# Patient Record
Sex: Male | Born: 1975 | Race: Black or African American | Hispanic: No | Marital: Married | Smoking: Never smoker
Health system: Southern US, Community
[De-identification: ages and names within clinical notes are randomized; demographics above are authoritative.]

## PROBLEM LIST (undated history)

## (undated) DIAGNOSIS — F419 Anxiety disorder, unspecified: Secondary | ICD-10-CM

## (undated) DIAGNOSIS — F319 Bipolar disorder, unspecified: Secondary | ICD-10-CM

## (undated) DIAGNOSIS — F32A Depression, unspecified: Secondary | ICD-10-CM

## (undated) HISTORY — PX: COLONOSCOPY, DIAGNOSTIC (SCREENING): SHX174

## (undated) HISTORY — PX: OTHER SURGICAL HISTORY: SHX169

## (undated) HISTORY — DX: Bipolar disorder, unspecified: F31.9

---

## 2011-05-29 DIAGNOSIS — F331 Major depressive disorder, recurrent, moderate: Secondary | ICD-10-CM | POA: Insufficient documentation

## 2017-12-07 HISTORY — PX: WISDOM TOOTH EXTRACTION: SHX21

## 2019-06-19 ENCOUNTER — Ambulatory Visit (FREE_STANDING_LABORATORY_FACILITY): Payer: No Typology Code available for payment source

## 2019-06-19 ENCOUNTER — Ambulatory Visit
Admission: RE | Admit: 2019-06-19 | Discharge: 2019-06-19 | Disposition: A | Discharge status: Home / Self Care | Payer: No Typology Code available for payment source | Source: Ambulatory Visit | Attending: Internal Medicine | Admitting: Internal Medicine

## 2019-06-19 ENCOUNTER — Encounter (INDEPENDENT_AMBULATORY_CARE_PROVIDER_SITE_OTHER): Payer: Self-pay | Admitting: Internal Medicine

## 2019-06-19 ENCOUNTER — Telehealth (INDEPENDENT_AMBULATORY_CARE_PROVIDER_SITE_OTHER): Payer: Self-pay | Admitting: Internal Medicine

## 2019-06-19 ENCOUNTER — Telehealth (INDEPENDENT_AMBULATORY_CARE_PROVIDER_SITE_OTHER): Payer: No Typology Code available for payment source | Admitting: Internal Medicine

## 2019-06-19 ENCOUNTER — Telehealth: Payer: Self-pay

## 2019-06-19 DIAGNOSIS — R059 Cough, unspecified: Secondary | ICD-10-CM

## 2019-06-19 DIAGNOSIS — R51 Headache: Secondary | ICD-10-CM

## 2019-06-19 DIAGNOSIS — R6889 Other general symptoms and signs: Secondary | ICD-10-CM

## 2019-06-19 DIAGNOSIS — R6883 Chills (without fever): Secondary | ICD-10-CM

## 2019-06-19 DIAGNOSIS — R05 Cough: Secondary | ICD-10-CM

## 2019-06-19 DIAGNOSIS — B349 Viral infection, unspecified: Secondary | ICD-10-CM

## 2019-06-19 DIAGNOSIS — R0602 Shortness of breath: Secondary | ICD-10-CM | POA: Insufficient documentation

## 2019-06-19 DIAGNOSIS — R197 Diarrhea, unspecified: Secondary | ICD-10-CM

## 2019-06-19 DIAGNOSIS — R519 Headache, unspecified: Secondary | ICD-10-CM

## 2019-06-19 DIAGNOSIS — Z03818 Encounter for observation for suspected exposure to other biological agents ruled out: Secondary | ICD-10-CM

## 2019-06-19 DIAGNOSIS — R110 Nausea: Secondary | ICD-10-CM

## 2019-06-19 DIAGNOSIS — R5383 Other fatigue: Secondary | ICD-10-CM | POA: Insufficient documentation

## 2019-06-19 DIAGNOSIS — Z20822 Contact with and (suspected) exposure to covid-19: Secondary | ICD-10-CM

## 2019-06-19 LAB — CBC AND DIFFERENTIAL
Absolute NRBC: 0 10*3/uL (ref 0.00–0.00)
Basophils Absolute Automated: 0.04 10*3/uL (ref 0.00–0.08)
Basophils Automated: 1 %
Eosinophils Absolute Automated: 0.09 10*3/uL (ref 0.00–0.44)
Eosinophils Automated: 2.2 %
Hematocrit: 46.5 % (ref 37.6–49.6)
Hgb: 15.5 g/dL (ref 12.5–17.1)
Immature Granulocytes Absolute: 0.02 10*3/uL (ref 0.00–0.07)
Immature Granulocytes: 0.5 %
Lymphocytes Absolute Automated: 1.37 10*3/uL (ref 0.42–3.22)
Lymphocytes Automated: 33.7 %
MCH: 29.2 pg (ref 25.1–33.5)
MCHC: 33.3 g/dL (ref 31.5–35.8)
MCV: 87.7 fL (ref 78.0–96.0)
MPV: 11.1 fL (ref 8.9–12.5)
Monocytes Absolute Automated: 0.35 10*3/uL (ref 0.21–0.85)
Monocytes: 8.6 %
Neutrophils Absolute: 2.2 10*3/uL (ref 1.10–6.33)
Neutrophils: 54 %
Nucleated RBC: 0 /100 WBC (ref 0.0–0.0)
Platelets: 195 10*3/uL (ref 142–346)
RBC: 5.3 10*6/uL (ref 4.20–5.90)
RDW: 12 % (ref 11–15)
WBC: 4.07 10*3/uL (ref 3.10–9.50)

## 2019-06-19 LAB — COMPREHENSIVE METABOLIC PANEL
ALT: 31 U/L (ref 0–55)
AST (SGOT): 18 U/L (ref 5–34)
Albumin/Globulin Ratio: 1.5 (ref 0.9–2.2)
Albumin: 4 g/dL (ref 3.5–5.0)
Alkaline Phosphatase: 77 U/L (ref 38–106)
Anion Gap: 9 (ref 5.0–15.0)
BUN: 18 mg/dL (ref 9.0–28.0)
Bilirubin, Total: 0.3 mg/dL (ref 0.2–1.2)
CO2: 28 mEq/L (ref 21–29)
Calcium: 9.5 mg/dL (ref 8.5–10.5)
Chloride: 104 mEq/L (ref 100–111)
Creatinine: 1.2 mg/dL (ref 0.5–1.5)
Globulin: 2.6 g/dL (ref 2.0–3.7)
Glucose: 100 mg/dL (ref 70–100)
Potassium: 4.2 mEq/L (ref 3.5–5.1)
Protein, Total: 6.6 g/dL (ref 6.0–8.3)
Sodium: 141 mEq/L (ref 136–145)

## 2019-06-19 LAB — HEMOLYSIS INDEX: Hemolysis Index: 8 (ref 0–18)

## 2019-06-19 LAB — GFR: EGFR: >60

## 2019-06-19 NOTE — Progress Notes (Signed)
Have you seen any specialists/other providers since your last visit with us?    No    Arm preference verified?   No    The patient is due for nothing at this time, HM is up-to-date.

## 2019-06-19 NOTE — Patient Instructions (Signed)
Please get your blood work, stool test, COVID test, and chest xray done.    Stay home and rest for 5 days.    Self quarantine until you get your negative COVID test.    If positive, you have to self quarantine for 14 days.    Follow up after test results become available.

## 2019-06-19 NOTE — Telephone Encounter (Signed)
Pt Rodney Ingram called [facility]  for advice related to their current health.     Pt last evaluated by office on:  NEW PT      COVID-19 Questionnaire:  Last updated: Apr 14, 2019    Fever: FEELS FEVERISH  Cough: YES  Dyspnea: WITH EXERTION  Fatigue: YES  Muscle or body aches: YES ARMS AND LEGS  Headache: YES  New loss of taste or smell: NO  Nausea or vomiting: NAUSEA  Diarrhea:  YES  Close contact with a COVID-19 person?: NO  Recently been tested for COVID-19?: NO  Ever been diagnosed with COVID-19?: NO  Live in a group living residence (such as an assisted-living facility, nursing home, shelter, or dormitory)?: NO  Work in a healthcare facility?: NO       Pt has been sick for  days.    Pt complaints in addition to those documented above:  PT STATES WENT TO [place] July 4 AND SSX WITH SSX 5 DAYS PT WAS AROUND CROWDS    Pt currently taking these medications:   No current outpatient medications on file.     No current facility-administered medications for this visit.        Assessment/Plan:    VIDEO APPT MADE. VERIFIED INSURANCE, EMAIL AND CONTACT NUMBER. ROUTED NOTE TO PROVIDER AND OFFICE STAFF.

## 2019-06-19 NOTE — Telephone Encounter (Signed)
Error

## 2019-06-19 NOTE — Progress Notes (Signed)
Subjective:     Verbal consent has been obtained from the patient to conduct a video and telephone visit to minimize exposure to COVID-19: yes    Date: 06/19/2019 10:16 AM   Patient ID: Rodney Ingram is a 43 y.o. male.    Chief Complaint:  Chief Complaint   Patient presents with    Covid-19 Screening     pt is c/o nausea, headaches, chills, sob, fatigue for the about 5 days and still progressing. went to [facility] for 06/10/2019       HPI    The purpose of today's visit is to address conditions listed below.    Patient Active Problem List   Diagnosis    Nausea    Headache    Chills    SOB (shortness of breath)    Fatigue       Pt reports:    C/o worsening headache, chills, nausea, SOB, fatigue, muscle aches, runny nose, diarrhea x 5 days, went to [place] on 06/10/19, was around crowds, worried about possible COVID infection, wore a mask    Headaches (left temple, sharp, lasting for 3-4 hrs) started 5 days ago, followed by nausea (no vomiting), fatigue, body aches started 3 days go, started having SOB yesterday afternoon, loose stool started 3 days ago, chills started 3 days ago, runny nose 2 days ago.      Pt denies F/C/N/V/D/CP/SOB/abd pain/urinary sx's/focal weakness, numbness or any other sx's.    Pt's recent medical records prior to today's visit were reviewed.    Last office visit here: N/A    Last specialty office visit:    No results found for: WBC, HGB, HCT, MCV, PLT    No results found for: CHOL, TRIG, HDL, LDL, CREAT, TSH, GLU, HGBA1C, VITD, 25HYDROXYDTO, INR, PT    No results found for: ALT, AST, ALKPHOS, BILITOTAL    Last immunizations:      There is no immunization history on file for this patient.     Health Maintenance:    There are no preventive care reminders to display for this patient.      Current Medications:    Current Outpatient Medications on File Prior to Visit   Medication Sig Dispense Refill    Multiple Vitamins-Minerals (MULTIVITAMIN ADULT PO) Take by mouth       No current facility-administered  medications on file prior to visit.        Allergies   Allergen Reactions    Penicillins Facial Swelling       History reviewed. No pertinent past medical history.    Past Surgical History:   Procedure Laterality Date    COLONOSCOPY      WISDOM TOOTH EXTRACTION  2019       History reviewed. No pertinent family history.    Social History     Tobacco Use    Smoking status: Never Smoker    Smokeless tobacco: Never Used   Substance Use Topics    Alcohol use: Yes    Drug use: Never       The following sections were reviewed this encounter by the provider:   Tobacco   Allergies   Meds   Problems   Med Hx   Surg Hx   Fam Hx            Review of Systems    All other systems were reviewed and are negative except as noted in HPI.         Objective:  Due to the current COVID-19 pandemic, this encounter was conducted as video and telephone visit to assure safety of the patient.    General: awake, alert, oriented x 3; no acute distress.    Assessment/Plan:       1. Nausea    2. Nonintractable headache, unspecified chronicity pattern, unspecified headache type    3. Chills    4. SOB (shortness of breath)  - X-ray chest PA and lateral; Future    5. Fatigue, unspecified type    6. Viral syndrome  - COVID-19 (SARS-CoV-2) [facility]; Future  - CBC without differential; Future  - Comprehensive metabolic panel; Future  - X-ray chest PA and lateral; Future    7. Diarrhea, unspecified type  - CBC without differential; Future  - Comprehensive metabolic panel; Future  - Culture, Stool, SALM/SHIGELLA/CAMPY/EHEC; Future  - Ova and parasite examination; Future  - Stool Cryptosporidium / Giardia Antigen; Future    Concerning for possible COVID-19.  Bld work, stool study, COVID test, CXR.  Stay home to rest for 5 days, letter sent to Pt.  Follow up after test results become available.      Following instructions were given to Pt:    "Please get your blood work, stool test, COVID test, and chest xray done.    Stay home and rest  for 5 days.    Self quarantine until you get your negative COVID test.    If positive, you have to self quarantine for 14 days.    Follow up after test results become available."    Follow up:     Return in about 1 week (around 06/26/2019) for review test results.    Gardiner Sleeper, MD    TOTAL TIME SPENT WITH PATIENT: 20 minutes    TIME SPENT COUNSELING/COORDINATING CARE: >50% of total time spent with patient was regarding counseling/coordinating of care - see Pt's instructions above.    Risk & Benefits of any previous or new medication(s) were explained to the patient who verbalized understanding & agreed to the treatment plan.  Pt was advise to call with updates/questions/concerns .

## 2019-06-20 ENCOUNTER — Encounter (INDEPENDENT_AMBULATORY_CARE_PROVIDER_SITE_OTHER): Payer: Self-pay

## 2019-06-20 ENCOUNTER — Ambulatory Visit
Admission: RE | Admit: 2019-06-20 | Discharge: 2019-06-20 | Disposition: A | Discharge status: Home / Self Care | Payer: No Typology Code available for payment source | Source: Ambulatory Visit | Attending: Internal Medicine | Admitting: Internal Medicine

## 2019-06-20 DIAGNOSIS — R197 Diarrhea, unspecified: Secondary | ICD-10-CM | POA: Insufficient documentation

## 2019-06-21 ENCOUNTER — Encounter (INDEPENDENT_AMBULATORY_CARE_PROVIDER_SITE_OTHER): Payer: Self-pay

## 2019-06-21 LAB — COVID-19 (SARS-COV-2): SARS CoV 2 Overall Result: NOT DETECTED

## 2019-06-28 LAB — OVA AND PARASITE EXAMINATION

## 2019-06-29 ENCOUNTER — Encounter (INDEPENDENT_AMBULATORY_CARE_PROVIDER_SITE_OTHER): Payer: Self-pay

## 2019-09-20 ENCOUNTER — Telehealth (INDEPENDENT_AMBULATORY_CARE_PROVIDER_SITE_OTHER): Payer: Self-pay | Admitting: Internal Medicine

## 2019-09-20 NOTE — Telephone Encounter (Signed)
Byrd Hesselbach from [facility] department requested an updated Chest Xray order for 06-19-2019 with a Covid Diagnosis Fax to her Attn Fax# [phone], if any questions she can be reach at [phone]

## 2019-09-22 NOTE — Telephone Encounter (Signed)
Fwd

## 2019-09-22 NOTE — Telephone Encounter (Signed)
I can't retrodate the order.  COVID was negative, so what do they need from me?

## 2019-09-22 NOTE — Telephone Encounter (Signed)
Advised Rod Holler w/ [facility] billing

## 2022-05-20 ENCOUNTER — Encounter (HOSPITAL_COMMUNITY): Payer: Self-pay | Admitting: *Deleted

## 2022-05-20 ENCOUNTER — Ambulatory Visit (INDEPENDENT_AMBULATORY_CARE_PROVIDER_SITE_OTHER): Payer: 59

## 2022-05-20 ENCOUNTER — Other Ambulatory Visit: Payer: Self-pay

## 2022-05-20 ENCOUNTER — Emergency Department (HOSPITAL_COMMUNITY): Payer: 59

## 2022-05-20 ENCOUNTER — Ambulatory Visit
Admission: EM | Admit: 2022-05-20 | Discharge: 2022-05-20 | Disposition: A | Discharge status: Home / Self Care | Payer: 59

## 2022-05-20 ENCOUNTER — Emergency Department (HOSPITAL_COMMUNITY)
Admission: EM | Admit: 2022-05-20 | Discharge: 2022-05-20 | Disposition: A | Discharge status: Home / Self Care | Payer: 59 | Attending: Emergency Medicine | Admitting: Emergency Medicine

## 2022-05-20 DIAGNOSIS — M7989 Other specified soft tissue disorders: Secondary | ICD-10-CM | POA: Insufficient documentation

## 2022-05-20 DIAGNOSIS — S63257A Unspecified dislocation of left little finger, initial encounter: Secondary | ICD-10-CM | POA: Diagnosis not present

## 2022-05-20 DIAGNOSIS — S63259A Unspecified dislocation of unspecified finger, initial encounter: Secondary | ICD-10-CM

## 2022-05-20 DIAGNOSIS — W230XXA Caught, crushed, jammed, or pinched between moving objects, initial encounter: Secondary | ICD-10-CM | POA: Insufficient documentation

## 2022-05-20 DIAGNOSIS — Y9367 Activity, basketball: Secondary | ICD-10-CM | POA: Diagnosis not present

## 2022-05-20 DIAGNOSIS — S63287A Dislocation of proximal interphalangeal joint of left little finger, initial encounter: Secondary | ICD-10-CM | POA: Diagnosis not present

## 2022-05-20 DIAGNOSIS — S6992XA Unspecified injury of left wrist, hand and finger(s), initial encounter: Secondary | ICD-10-CM | POA: Diagnosis present

## 2022-05-20 MED ORDER — LIDOCAINE HCL (PF) 1 % IJ SOLN
5.0000 mL | Freq: Once | INTRAMUSCULAR | Status: AC
  Administered 2022-05-20: 5 mL
  Filled 2022-05-20: qty 5

## 2022-05-20 MED ORDER — IBUPROFEN 400 MG PO TABS
600.0000 mg | ORAL_TABLET | Freq: Once | ORAL | Status: AC
  Administered 2022-05-20: 600 mg via ORAL
  Filled 2022-05-20: qty 2

## 2022-05-20 NOTE — ED Notes (Signed)
Edp in room  

## 2022-05-20 NOTE — ED Triage Notes (Signed)
Pt reports pain and swelling in the left pinky finger, after he fell over when playing basketball around 15:00 today.

## 2022-05-20 NOTE — ED Provider Notes (Signed)
[facility]    CSN: [id number] Arrival date & time: 05/20/22  1656      History   Chief Complaint Chief Complaint  Patient presents with   Finger Injury    HPI Keith Gilbert is a 46 y.o. male.   Presenting today with about 3 hours of left little finger pain, swelling after falling directly onto the finger with his entire body weight.  He states he does feel some numbness to the area, no discoloration.  Does not have good range of motion in the finger.  So far not trying thing over-the-counter for symptoms, came straight in after it happened.    History reviewed. No pertinent past medical history.  There are no problems to display for this patient.   History reviewed. No pertinent surgical history.     Home Medications    Prior to Admission medications   Medication Sig Start Date End Date Taking? Authorizing Provider  Multiple Vitamin (MULTIVITAMIN) tablet Take 1 tablet by mouth daily.   Yes [provider]    Family History Family History  Problem Relation Age of Onset   Heart disease Mother    Heart disease Father     Social History Social History   Tobacco Use   Smoking status: Never   Smokeless tobacco: Never  Substance Use Topics   Alcohol use: Yes   Drug use: Yes    Frequency: 4.0 times per week    Types: Marijuana     Allergies   Morphine and Minocycline   Review of Systems Review of Systems Per HPI  Physical Exam Triage Vital Signs ED Triage Vitals  Enc Vitals Group     BP 05/20/22 1835 122/81     Pulse Rate 05/20/22 1835 96     Resp 05/20/22 1835 16     Temp 05/20/22 1835 98.1 F (36.7 C)     Temp Source 05/20/22 1835 Oral     SpO2 05/20/22 1835 95 %     Weight --      Height --      Head Circumference --      Peak Flow --      Pain Score 05/20/22 1833 3     Pain Loc --      Pain Edu? --      Excl. in [place]? --    No data found.  Updated Vital Signs BP 122/81 (BP Location: Right Arm)   Pulse 96    Temp 98.1 F (36.7 C) (Oral)   Resp 16   SpO2 95%   Visual Acuity Right Eye Distance:   Left Eye Distance:   Bilateral Distance:    Right Eye Near:   Left Eye Near:    Bilateral Near:     Physical Exam Vitals and nursing note reviewed.  Constitutional:      Appearance: Normal appearance.  HENT:     Head: Atraumatic.  Eyes:     Extraocular Movements: Extraocular movements intact.     Conjunctiva/sclera: Conjunctivae normal.  Cardiovascular:     Rate and Rhythm: Normal rate and regular rhythm.  Pulmonary:     Effort: Pulmonary effort is normal.     Breath sounds: Normal breath sounds.  Musculoskeletal:        General: Swelling, tenderness, deformity and signs of injury present.     Cervical back: Normal range of motion and neck supple.     Comments: Deformity at the PIP joint of the left little finger, tenderness to  palpation, diffuse edema to this area  Skin:    General: Skin is warm and dry.  Neurological:     General: No focal deficit present.     Mental Status: He is oriented to person, place, and time.     Comments: Left hand neurovascular intact  Psychiatric:        Mood and Affect: Mood normal.        Thought Content: Thought content normal.        Judgment: Judgment normal.    UC Treatments / Results  Labs (all labs ordered are listed, but only abnormal results are displayed) Labs Reviewed - No data to display  EKG   Radiology DG Finger Little Left  Result Date: 05/20/2022 CLINICAL DATA:  Hyperextended finger playing basketball EXAM: LEFT LITTLE FINGER 2+V COMPARISON:  None Available. FINDINGS: There is dorsal dislocation of the fifth digit at the proximal interphalangeal joint. The middle phalanx is displaced 1 bone width dorsally in relation to the proximal phalanx. No fracture. IMPRESSION: Dislocation of the fifth digit at the PIP. Electronically Signed   By: Suzy Bouchard M.D.   On: 05/20/2022 18:48    Procedures Procedures (including critical  care time)  Medications Ordered in UC Medications - No data to display  Initial Impression / Assessment and Plan / UC Course  I have reviewed the triage vital signs and the nursing notes.  Pertinent labs & imaging results that were available during my care of the patient were reviewed by me and considered in my medical decision making (see chart for details).     X-ray showing a dislocation of the left little finger at the PIP joint.  Recommended that patient go to the emergency department for pain control and intervention which she is readily agreeable to.  Discussed protection of the joint with a loose finger splint in the meantime.  Wishes to go via private vehicle and is hemodynamically stable to do so.  Final Clinical Impressions(s) / UC Diagnoses   Final diagnoses:  Dislocation of finger, initial encounter   Discharge Instructions   None    ED Prescriptions   None    PDMP not reviewed this encounter.   Volney American, [place] 05/20/22 1901

## 2022-05-20 NOTE — ED Provider Notes (Signed)
[facility] EMERGENCY DEPARTMENT Provider Note   CSN: [id number] Arrival date & time: 05/20/22  1923     History  Chief Complaint  Patient presents with   Finger Injury    Keith Gilbert is a 46 y.o. male.  HPI 46 year old male presents after a left pinky injury.  He was playing basketball and landed on the floor on his hand and thinks he jammed his finger.  No numbness but is having pain.  He went to urgent care where he was diagnosed with a dislocated PIP joint and referred here.  He is right-handed.  No significant past medical history.  Home Medications Prior to Admission medications   Medication Sig Start Date End Date Taking? Authorizing Provider  aspirin 81 MG chewable tablet Chew 81 mg by mouth daily as needed for moderate pain.   Yes [provider]  Coenzyme Q10 300 MG CAPS Take by mouth. 05/31/21  Yes [provider]  Multiple Vitamin (MULTIVITAMIN) tablet Take 1 tablet by mouth daily.   Yes [provider]      Allergies    Morphine and Minocycline    Review of Systems   Review of Systems  Musculoskeletal:  Positive for arthralgias and joint swelling.  Neurological:  Negative for numbness.    Physical Exam Updated Vital Signs BP 124/75   Pulse 85   Temp 97.9 F (36.6 C) (Oral)   Resp 16   Ht 6\' 1"  (1.854 m)   Wt 99.8 kg   SpO2 98%   BMI 29.03 kg/m  Physical Exam Vitals and nursing note reviewed.  Constitutional:      Appearance: He is well-developed.  HENT:     Head: Normocephalic and atraumatic.  Cardiovascular:     Rate and Rhythm: Normal rate.     Pulses:          Radial pulses are 2+ on the left side.  Pulmonary:     Effort: Pulmonary effort is normal.  Abdominal:     Tenderness: There is no abdominal tenderness.  Musculoskeletal:     Comments: Left pinky finger with proximal swelling and tenderness.  No lacerations.  Skin:    General: Skin is warm and dry.  Neurological:     Mental Status: He is alert.      ED Results / Procedures / Treatments   Labs (all labs ordered are listed, but only abnormal results are displayed) Labs Reviewed - No data to display  EKG None  Radiology DG Finger Little Left  Result Date: 05/20/2022 CLINICAL DATA:  Post reduction. EXAM: LEFT LITTLE FINGER 2+V COMPARISON:  Earlier radiograph dated 05/20/2022. FINDINGS: Interval reduction of the previously seen dislocated PIP joint of the fifth digit, now in anatomic alignment. No fracture identified. A splint has been placed. IMPRESSION: Successful reduction of the previously seen dislocated PIP joint of the fifth digit. Electronically Signed   By: 05/22/2022 M.D.   On: 05/20/2022 21:37   DG Finger Little Left  Result Date: 05/20/2022 CLINICAL DATA:  Hyperextended finger playing basketball EXAM: LEFT LITTLE FINGER 2+V COMPARISON:  None Available. FINDINGS: There is dorsal dislocation of the fifth digit at the proximal interphalangeal joint. The middle phalanx is displaced 1 bone width dorsally in relation to the proximal phalanx. No fracture. IMPRESSION: Dislocation of the fifth digit at the PIP. Electronically Signed   By: 05/22/2022 M.D.   On: 05/20/2022 18:48    Procedures Reduction of dislocation  Date/Time: 05/20/2022 9:03 PM  Performed by:  Pricilla Loveless, MD Authorized by: Pricilla Loveless, MD  Consent: Verbal consent obtained. Risks and benefits: risks, benefits and alternatives were discussed Consent given by: patient Preparation: Patient was prepped and draped in the usual sterile fashion. Local anesthesia used: yes Anesthesia: digital block  Anesthesia: Local anesthesia used: yes Local Anesthetic: lidocaine 1% without epinephrine Anesthetic total: 5 mL  Sedation: Patient sedated: no  Patient tolerance: patient tolerated the procedure well with no immediate complications Comments: Closed reduction without difficulty.  Normal range of motion afterwards.       Medications  Ordered in ED Medications  lidocaine (PF) (XYLOCAINE) 1 % injection 5 mL (5 mLs Other Given 05/20/22 2040)  ibuprofen (ADVIL) tablet 600 mg (600 mg Oral Given 05/20/22 2058)    ED Course/ Medical Decision Making/ A&P                           Medical Decision Making Amount and/or Complexity of Data Reviewed External Data Reviewed: notes.    Details: Urgent care Radiology: ordered and independent interpretation performed.  Risk Prescription drug management.   Patient presents from urgent care after a closed left fifth finger PIP dislocation.  Neurovascular intact.  I viewed the urgent care x-rays with the dorsal dislocation.  Finger was anesthetized as above and reduced without difficulty.  Postreduction x-ray image viewed by myself, is completely reduced. No fractures. Will refer to ortho. Splint applied.         Final Clinical Impression(s) / ED Diagnoses Final diagnoses:  Closed dislocation of finger of left hand, initial encounter    Rx / DC Orders ED Discharge Orders     None         Pricilla Loveless, MD 05/20/22 2204

## 2022-05-20 NOTE — ED Triage Notes (Signed)
Pt seen at [facility] and sent here to set pt's dislocated finger. Injured while playing basketball earlier today.

## 2022-05-20 NOTE — ED Notes (Signed)
Finger splint applied and secured with coban for transport to [facility] ED.

## 2022-05-20 NOTE — ED Notes (Signed)
Patient is being discharged from the Urgent Care and sent to the Emergency Department via POV . Per PA, patient is in need of higher level of care due to finger injury. Patient is aware and verbalizes understanding of plan of care.  Vitals:   05/20/22 1835  BP: 122/81  Pulse: 96  Resp: 16  Temp: 98.1 F (36.7 C)  SpO2: 95%

## 2022-06-17 DIAGNOSIS — F319 Bipolar disorder, unspecified: Secondary | ICD-10-CM | POA: Insufficient documentation

## 2022-07-09 ENCOUNTER — Encounter: Payer: Self-pay | Admitting: Podiatry

## 2022-07-09 ENCOUNTER — Ambulatory Visit (INDEPENDENT_AMBULATORY_CARE_PROVIDER_SITE_OTHER): Payer: 59 | Admitting: Podiatry

## 2022-07-09 DIAGNOSIS — L603 Nail dystrophy: Secondary | ICD-10-CM | POA: Diagnosis not present

## 2022-07-11 NOTE — Progress Notes (Signed)
  Subjective:  Patient ID: Keith Gilbert, male    DOB: 05-Dec-1976,  MRN: [id number] HPI Chief Complaint  Patient presents with   Foot Problem     L GREAT TOE - NAIL HAS COME OFF / DISCOLORED / STINGING SENSATION    46 y.o. male presents with the above complaint.   ROS: Denies fever chills nausea vomiting muscle aches pains calf pain back pain chest pain shortness of breath.  No past medical history on file. No past surgical history on file.  Current Outpatient Medications:    aspirin 81 MG chewable tablet, Chew 81 mg by mouth daily as needed for moderate pain., Disp: , Rfl:    Coenzyme Q10 300 MG CAPS, Take by mouth., Disp: , Rfl:    lurasidone (LATUDA) 40 MG TABS tablet, Take 20 mg by mouth daily with breakfast., Disp: , Rfl:    Multiple Vitamin (MULTIVITAMIN) tablet, Take 1 tablet by mouth daily., Disp: , Rfl:   Allergies  Allergen Reactions   Morphine Swelling    Other reaction(s): Other (See Comments)   Minocycline Rash   Review of Systems Objective:  There were no vitals filed for this visit.  General: Well developed, nourished, in no acute distress, alert and oriented x3   Dermatological: Skin is warm, dry and supple bilateral. Nails x 10 are well maintained; remaining integument appears unremarkable at this time. There are no open sores, no preulcerative lesions, no rash or signs of infection present.  A portion of the nail is remaining.  I do think that this is most likely a nail dystrophy his fifth nails bilaterally are similar  Vascular: Dorsalis Pedis artery and Posterior Tibial artery pedal pulses are 2/4 bilateral with immedate capillary fill time. Pedal hair growth present. No varicosities and no lower extremity edema present bilateral.   Neruologic: Grossly intact via light touch bilateral. Vibratory intact via tuning fork bilateral. Protective threshold with Semmes Wienstein monofilament intact to all pedal sites bilateral. Patellar and Achilles deep tendon  reflexes 2+ bilateral. No Babinski or clonus noted bilateral.   Musculoskeletal: No gross boney pedal deformities bilateral. No pain, crepitus, or limitation noted with foot and ankle range of motion bilateral. Muscular strength 5/5 in all groups tested bilateral.  Gait: Unassisted, Nonantalgic.    Radiographs:  None taken  Assessment & Plan:   Assessment: Nail dystrophy  Plan: Samples of the skin and nail taken today for pathologic evaluation follow-up with him in 4 weeks for results     Dellanira Dillow T. Sargent, [place]

## 2022-08-20 ENCOUNTER — Ambulatory Visit: Payer: 59 | Admitting: Podiatrist

## 2022-08-20 ENCOUNTER — Ambulatory Visit: Payer: 59 | Admitting: Podiatry

## 2022-08-20 ENCOUNTER — Encounter: Payer: Self-pay | Admitting: Podiatrist

## 2022-08-20 DIAGNOSIS — Z79899 Other long term (current) drug therapy: Secondary | ICD-10-CM

## 2022-08-20 DIAGNOSIS — L603 Nail dystrophy: Secondary | ICD-10-CM

## 2022-08-20 MED ORDER — EFINACONAZOLE 10 % EX SOLN: 1.0000 [drp] | Freq: Every day | CUTANEOUS | 2 refills | Status: DC

## 2022-08-21 ENCOUNTER — Encounter: Payer: Self-pay | Admitting: Podiatrist

## 2022-08-21 NOTE — Progress Notes (Signed)
Chief Complaint  Patient presents with   Results    [name]     HPI: Patient is 46 y.o. male who presents today for the results of his toenail culture which was performed at his last visit.   Allergies  Allergen Reactions   Morphine Swelling    Other reaction(s): Other (See Comments)   Minocycline Rash    Review of systems is negative except as noted in the HPI.  Denies nausea/ vomiting/ fevers/ chills or night sweats.   Denies difficulty breathing, denies calf pain or tenderness  Physical Exam  Patient is awake, alert, and oriented x 3.  In no acute distress.    Vascular status is intact with palpable pedal pulses DP and PT bilateral and capillary refill time less than 3 seconds bilateral.  No edema or erythema noted.   Neurological exam reveals epicritic and protective sensation grossly intact bilateral.   Dermatological exam reveals skin is supple and dry to bilateral feet.  Fifth digital nails remain thickened and unchanged  Musculoskeletal exam: Musculature intact with dorsiflexion, plantarflexion, inversion, eversion. Ankle and First MPJ joint range of motion normal.   Lab result:  Positive for onychomycosis) probable dermatophytes) total dystrophic pattern of growth is noted.  Moderate fungal growth observed. (BAK))  Assessment:    ICD-10-CM   1. Nail dystrophy  L60.3       Plan: Discussed treatment options and alternatives with Keith Gilbert at today's visit.  Discussed oral versus topical therapies.  The patient would like to try topical therapies first as he is currently taking a medication that might interact with Lamisil as they both are processed by the liver.  I did call in a prescription for Jublia and asked him to contact me should this prescription proved to be expensive and I can try another alternative at a specialty pharmacy.  Otherwise he will be seen back as needed in the future for follow-up and will call if any problems or concerns arise with medication.

## 2022-09-15 ENCOUNTER — Ambulatory Visit
Admission: EM | Admit: 2022-09-15 | Discharge: 2022-09-15 | Disposition: A | Discharge status: Home / Self Care | Payer: 59 | Attending: Nurse Practitioner | Admitting: Nurse Practitioner

## 2022-09-15 DIAGNOSIS — L089 Local infection of the skin and subcutaneous tissue, unspecified: Secondary | ICD-10-CM | POA: Diagnosis not present

## 2022-09-15 MED ORDER — SULFAMETHOXAZOLE-TRIMETHOPRIM 800-160 MG PO TABS: 1.0000 | ORAL_TABLET | Freq: Two times a day (BID) | ORAL | 0 refills | Status: AC

## 2022-09-15 NOTE — Discharge Instructions (Addendum)
Take medication as prescribed. May take over-the-counter ibuprofen or Tylenol as needed for pain, fever, or general discomfort. Do not scratch, rub, or disrupt the area while symptoms persist. Continue to monitor the area for any worsening oozing, drainage, or swelling.  If so, apply ice to the affected area as needed. Go to the emergency department immediately if you develop worsening swelling, foul-smelling drainage, redness or streaking that goes up or down the leg, or if you develop fever, chills, chest pain, abdominal pain, nausea, vomiting, or diarrhea. Follow-up as needed.

## 2022-09-15 NOTE — ED Triage Notes (Signed)
Pt reports insect bite left lower leg 3 1/2 weeks ago. Reports area was reds, sore and hot to touch. Pt reports muscle aches at night.Pt reports his kid had a tick 2 month ago. Pt is concern for Lyme disease.

## 2022-09-15 NOTE — ED Provider Notes (Signed)
[facility]    CSN: [id number] Arrival date & time: 09/15/22  1727      History   Chief Complaint Chief Complaint  Patient presents with   Insect Bite    HPI Keith Gilbert is a 46 y.o. male.   The history is provided by the patient and the spouse.   Patient presents for complaints of an insect bite to the left lower leg 3-1/2 weeks ago.  Patient states when he noticed the bite, he did have some "pus" appearing drainage from the site.  He states the area was also red, sore, and warm to touch.  He states since that time, he is also experienced intermittent muscle aches in his arms at nighttime.  Patient states that he did pull a tick off of his dog approximately 2 months ago, and was concerned that it was a tick.  He denies fever, chills, fatigue, lightheadedness, dizziness, chest pain, shortness of breath, difficulty breathing, nausea, vomiting, or diarrhea. History reviewed. No pertinent past medical history.  Patient Active Problem List   Diagnosis Date Noted   Bipolar 1 disorder ([place]) 06/17/2022    History reviewed. No pertinent surgical history.     Home Medications    Prior to Admission medications   Medication Sig Start Date End Date Taking? Authorizing Provider  sulfamethoxazole-trimethoprim (BACTRIM DS) 800-160 MG tablet Take 1 tablet by mouth 2 (two) times daily for 7 days. 09/15/22 09/22/22 Yes Elvia Aydin-Warren, Alda Lea, NP  Coenzyme Q10 300 MG CAPS Take by mouth. 05/31/21   [provider]  Efinaconazole 10 % SOLN Apply 1 drop topically daily. 08/20/22   Bronson Ing, DPM  lurasidone (LATUDA) 40 MG TABS tablet Take 20 mg by mouth daily with breakfast.    [provider]  Multiple Vitamin (MULTIVITAMIN) tablet Take 1 tablet by mouth daily.    [provider]    Family History Family History  Problem Relation Age of Onset   Heart disease Mother    Heart disease Father     Social History Social History   Tobacco  Use   Smoking status: Never   Smokeless tobacco: Never  Substance Use Topics   Alcohol use: Yes   Drug use: Yes    Frequency: 4.0 times per week    Types: Marijuana     Allergies   Morphine and Minocycline   Review of Systems Review of Systems Per HPI  Physical Exam Triage Vital Signs ED Triage Vitals [09/15/22 1736]  Enc Vitals Group     BP 113/73     Pulse Rate 64     Resp 16     Temp 98 F (36.7 C)     Temp Source Oral     SpO2 95 %     Weight      Height      Head Circumference      Peak Flow      Pain Score 0     Pain Loc      Pain Edu?      Excl. in [place]?    No data found.  Updated Vital Signs BP 113/73 (BP Location: Right Arm)   Pulse 64   Temp 98 F (36.7 C) (Oral)   Resp 16   SpO2 95%   Visual Acuity Right Eye Distance:   Left Eye Distance:   Bilateral Distance:    Right Eye Near:   Left Eye Near:    Bilateral Near:     Physical  Exam Vitals and nursing note reviewed.  Constitutional:      General: He is not in acute distress.    Appearance: Normal appearance.  HENT:     Head: Normocephalic.     Nose: Nose normal.  Eyes:     Extraocular Movements: Extraocular movements intact.     Conjunctiva/sclera: Conjunctivae normal.     Pupils: Pupils are equal, round, and reactive to light.  Pulmonary:     Effort: Pulmonary effort is normal.  Musculoskeletal:     Cervical back: Normal range of motion.  Lymphadenopathy:     Cervical: No cervical adenopathy.  Skin:    General: Skin is warm and dry.     Comments: Flat induration noted to the anterior portion of the left lower extremity.  Area is hyperpigmented centrally with an area of hypopigmentation, with hyperpigmentation noted around the site.  Area does have an central punctum that is firm and tender to palpation.  There is no oozing, fluctuance, or drainage present.  Neurological:     General: No focal deficit present.     Mental Status: He is alert.  Psychiatric:        Mood and  Affect: Mood normal.        Behavior: Behavior normal.      UC Treatments / Results  Labs (all labs ordered are listed, but only abnormal results are displayed) Labs Reviewed - No data to display  EKG   Radiology No results found.  Procedures Procedures (including critical care time)  Medications Ordered in UC Medications - No data to display  Initial Impression / Assessment and Plan / UC Course  I have reviewed the triage vital signs and the nursing notes.  Pertinent labs & imaging results that were available during my care of the patient were reviewed by me and considered in my medical decision making (see chart for details).  Patient presents for a possible insect bite to the left lower extremity.  On exam, the patient does have an area of induration that is hypo and hyperpigmented.  Area has a firm induration noted under the skin.  We will treat patient prophylactically with Bactrim for his symptoms.  Supportive care recommendations were provided to the patient along with strict indications of when to go to the emergency department.  Patient was advised to continue to monitor the area for any worsening.  Patient and spouse verbalized understanding.  All questions were answered. Final Clinical Impressions(s) / UC Diagnoses   Final diagnoses:  Skin infection     Discharge Instructions      Take medication as prescribed. May take over-the-counter ibuprofen or Tylenol as needed for pain, fever, or general discomfort. Do not scratch, rub, or disrupt the area while symptoms persist. Continue to monitor the area for any worsening oozing, drainage, or swelling.  If so, apply ice to the affected area as needed. Go to the emergency department immediately if you develop worsening swelling, foul-smelling drainage, redness or streaking that goes up or down the leg, or if you develop fever, chills, chest pain, abdominal pain, nausea, vomiting, or diarrhea. Follow-up as  needed.     ED Prescriptions     Medication Sig Dispense Auth. Provider   sulfamethoxazole-trimethoprim (BACTRIM DS) 800-160 MG tablet Take 1 tablet by mouth 2 (two) times daily for 7 days. 14 tablet Yanuel Tagg-Warren, Alda Lea, NP      PDMP not reviewed this encounter.   Tish Men, NP 09/15/22 1809

## 2022-10-19 ENCOUNTER — Encounter: Payer: Self-pay | Admitting: Urology

## 2022-10-19 ENCOUNTER — Ambulatory Visit (INDEPENDENT_AMBULATORY_CARE_PROVIDER_SITE_OTHER): Payer: BC Managed Care – PPO | Admitting: Urology

## 2022-10-19 VITALS — BP 125/83 | HR 74 | Ht 72.0 in | Wt 210.0 lb

## 2022-10-19 DIAGNOSIS — R3915 Urgency of urination: Secondary | ICD-10-CM | POA: Diagnosis not present

## 2022-10-19 DIAGNOSIS — N529 Male erectile dysfunction, unspecified: Secondary | ICD-10-CM | POA: Insufficient documentation

## 2022-10-19 DIAGNOSIS — R6882 Decreased libido: Secondary | ICD-10-CM

## 2022-10-19 LAB — URINALYSIS, ROUTINE W REFLEX MICROSCOPIC
Bilirubin, UA: NEGATIVE
Glucose, UA: NEGATIVE
Leukocytes,UA: NEGATIVE
Nitrite, UA: NEGATIVE
RBC, UA: NEGATIVE
Specific Gravity, UA: 1.025 (ref 1.005–1.030)
Urobilinogen, Ur: 0.2 mg/dL (ref 0.2–1.0)
pH, UA: 5.5 (ref 5.0–7.5)

## 2022-10-19 LAB — BLADDER SCAN AMB NON-IMAGING: Scan Result: 0

## 2022-10-19 MED ORDER — SILDENAFIL CITRATE 100 MG PO TABS: 100.0000 mg | ORAL_TABLET | Freq: Every day | ORAL | 11 refills | Status: DC | PRN

## 2022-10-19 NOTE — Progress Notes (Signed)
Assessment: 1. Organic impotence   2. Decreased libido   3. Urinary urgency     Plan: Today I had a long discussion with the patient spending a total of 15 minutes discussing ED.  I discussed the pathophysiology, etiology, and natural history of ED as well as management options using a goal-oriented approach.  We discussed the following options: Medical therapy, vacuum erection device, penile injections, intraurethral suppository therapy (MUSE), and penile prosthesis. Patient educational materials concerning these options were given to the patient.  Given the acute onset of his symptoms, a psychogenic component is likely. We will check a testosterone level today given his decreased libido. Recommend a trial of sildenafil 100 mg prn.  Use and side effects discussed. He is not interested in any treatment for his urinary symptoms at the present time. Return to office in 6 weeks   Chief Complaint:  Chief Complaint  Patient presents with   Erectile Dysfunction    History of Present Illness:  Keith Gilbert is a 46 y.o. male who is seen in consultation from Lorelee Market, MD for evaluation of erectile dysfunction.  He says onset of symptoms approximately 5 months ago.  He was not having any problems achieving or maintaining his erections prior to this.  He reports a decrease sensation in the penis.  He is unable to achieve an erection on his own.  He reports rare spontaneous early morning erections.  No history of penile or pelvic trauma.  He does report a decreased libido.  No prior medical therapy.  No problems with ambulation.  No decrease sensation in the the scrotum or perineal area.  No lower extremity weakness or numbness.  He started a new medication for bipolar disease several weeks ago.  He denies any increased stressors with his marriage, job, or finances.  He is also concerned about some discoloration on the penis.    He reports urinary symptoms including frequency, urgency x2  months, nocturia x2, postvoid dribbling, and occasional urge incontinence.  No dysuria or gross hematuria. IPSS = 11 today.  Past Medical History:  Past Medical History:  Diagnosis Date   Bipolar 1 disorder [facility])     Past Surgical History:  Past Surgical History:  Procedure Laterality Date   None      Allergies:  Allergies  Allergen Reactions   Morphine Swelling    Other reaction(s): Other (See Comments)   Minocycline Rash    Family History:  Family History  Problem Relation Age of Onset   Heart disease Mother    Heart disease Father     Social History:  Social History   Tobacco Use   Smoking status: Never   Smokeless tobacco: Never  Substance Use Topics   Alcohol use: Yes   Drug use: Yes    Frequency: 4.0 times per week    Types: Marijuana    Review of symptoms:  Constitutional:  Negative for unexplained weight loss, night sweats, fever, chills ENT:  Negative for nose bleeds, sinus pain, painful swallowing CV:  Negative for chest pain, shortness of breath, exercise intolerance, palpitations, loss of consciousness Resp:  Negative for cough, wheezing, shortness of breath GI:  Negative for nausea, vomiting, diarrhea, bloody stools GU:  Positives noted in HPI; otherwise negative for gross hematuria, dysuria Neuro:  Negative for seizures, poor balance, limb weakness, slurred speech Psych:  Negative for lack of energy, depression, anxiety Endocrine:  Negative for polydipsia, polyuria, symptoms of hypoglycemia (dizziness, hunger, sweating) Hematologic:  Negative for  anemia, purpura, petechia, prolonged or excessive bleeding, use of anticoagulants  Allergic:  Negative for difficulty breathing or choking as a result of exposure to anything; no shellfish allergy; no allergic response (rash/itch) to materials, foods  Physical exam: BP 125/83   Pulse 74   Ht 6' (1.829 m)   Wt 210 lb (95.3 kg)   BMI 28.48 kg/m  GENERAL APPEARANCE:  Well appearing, well developed,  well nourished, NAD HEENT: Atraumatic, Normocephalic, oropharynx clear. NECK: Supple without lymphadenopathy or thyromegaly. LUNGS: Clear to auscultation bilaterally. HEART: Regular Rate and Rhythm without murmurs, gallops, or rubs. ABDOMEN: Soft, non-tender, No Masses. EXTREMITIES: Moves all extremities well.  Without clubbing, cyanosis, or edema. NEUROLOGIC:  Alert and oriented x 3, normal gait, CN II-XII grossly intact.  MENTAL STATUS:  Appropriate. BACK:  Non-tender to palpation.  No CVAT SKIN:  Warm, dry and intact.   GU: Penis:  circumcised, some areas of pigmentation on glans, no lesions; normal sensation Meatus: Normal Scrotum: normal, no masses Testis: normal without masses bilateral Epididymis: normal   Results: U/A: 0-5 WBCs  PVR:  0 ml

## 2022-10-19 NOTE — Progress Notes (Signed)
post void residual =0mL 

## 2022-10-19 NOTE — Addendum Note (Signed)
Addended by: Grier Rocher on: 10/19/2022 10:27 AM   Modules accepted: Orders

## 2022-10-20 ENCOUNTER — Encounter: Payer: Self-pay | Admitting: Urology

## 2022-10-20 LAB — TESTOSTERONE: Testosterone: 332 ng/dL (ref 264–916)

## 2022-12-04 ENCOUNTER — Ambulatory Visit
Admission: EM | Admit: 2022-12-04 | Discharge: 2022-12-04 | Disposition: A | Discharge status: Home / Self Care | Payer: BC Managed Care – PPO | Attending: Family Medicine | Admitting: Family Medicine

## 2022-12-04 DIAGNOSIS — Z792 Long term (current) use of antibiotics: Secondary | ICD-10-CM | POA: Insufficient documentation

## 2022-12-04 DIAGNOSIS — H01004 Unspecified blepharitis left upper eyelid: Secondary | ICD-10-CM | POA: Insufficient documentation

## 2022-12-04 DIAGNOSIS — J069 Acute upper respiratory infection, unspecified: Secondary | ICD-10-CM | POA: Insufficient documentation

## 2022-12-04 DIAGNOSIS — Z1152 Encounter for screening for COVID-19: Secondary | ICD-10-CM | POA: Diagnosis not present

## 2022-12-04 MED ORDER — ERYTHROMYCIN 5 MG/GM OP OINT: TOPICAL_OINTMENT | OPHTHALMIC | 0 refills | Status: DC

## 2022-12-04 NOTE — ED Triage Notes (Signed)
Pt reports swelling in his left eye x 2 days. Occurred in his right eye since thanksgiving and just subsided  this week. Now his left eye is swollen and he doesn't know why. Claims he just woke up like that. Painful when he blinks.    Left upper eyelid is swollen

## 2022-12-04 NOTE — ED Provider Notes (Signed)
[facility]    CSN: [id number] Arrival date & time: 12/04/22  1225      History   Chief Complaint Chief Complaint  Patient presents with   Eye Problem    HPI Keith Gilbert is a 46 y.o. male.   Presenting today with intermittent issues with eye irritation and eyelid swelling since Thanksgiving, states the issues were all in the right eye previously but that resolved on its own and now 2 days ago he woke up and his left upper eyelid was red, swollen and irritated.  He denies any injury to the area, states his eyes are crusted a bit in the morning but no drainage during the day, no visual change, fever, headache, nausea vomiting, new exposures.  Not tried anything over-the-counter for symptoms.  Also started with runny nose, chills and overall not feeling well a day or so ago.  Wanting to be tested for COVID.  No known sick contacts recently.    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 Authorizing Provider  erythromycin ophthalmic ointment Place a 1/2 inch ribbon of ointment into the left lower eyelid BID prn. 12/04/22  Yes Particia Nearing, PA-C  Coenzyme Q10 300 MG CAPS Take by mouth. 05/31/21   [provider]  Efinaconazole 10 % SOLN Apply 1 drop topically daily. 08/20/22   Delories Heinz, DPM  lurasidone (LATUDA) 40 MG TABS tablet Take 60 mg by mouth daily with breakfast.    [provider]  Multiple Vitamin (MULTIVITAMIN) tablet Take 1 tablet by mouth daily.    [provider]  sildenafil (VIAGRA) 100 MG tablet Take 1 tablet (100 mg total) by mouth  daily as needed for erectile dysfunction. 10/19/22   Stone[place], Danford Bad., MD    Family History Family History  Problem Relation Age of Onset   Heart disease Mother    Heart disease Father     Social History Social History   Tobacco Use   Smoking status: Never   Smokeless tobacco: Never  Substance Use Topics   Alcohol use: Yes   Drug use: Yes    Frequency: 4.0 times per week    Types: Marijuana     Allergies   Morphine and Minocycline   Review of Systems Review of Systems HPI  Physical Exam Triage Vital Signs ED Triage Vitals  Enc Vitals Group     BP 12/04/22 1302 126/81     Pulse Rate 12/04/22 1302 92     Resp 12/04/22 1302 20     Temp 12/04/22 1302 98.9 F (37.2 C)     Temp Source 12/04/22 1302 Oral     SpO2 12/04/22 1302 95 %     Weight --      Height --      Head Circumference --      Peak Flow --      Pain Score 12/04/22 1306 4     Pain Loc --      Pain Edu? --      Excl. in [place]? --  No data found.  Updated Vital Signs BP 126/81 (BP Location: Right Arm)   Pulse 92   Temp 98.9 F (37.2 C) (Oral)   Resp 20   SpO2 95%   Visual Acuity Right Eye Distance:   Left Eye Distance:   Bilateral Distance:    Right Eye Near:   Left Eye Near:    Bilateral Near:     Physical Exam Vitals and nursing note reviewed.  Constitutional:      Appearance: He is well-developed.  HENT:     Head: Atraumatic.     Right Ear: External ear normal.     Left Ear: External ear normal.     Nose: Rhinorrhea present.     Mouth/Throat:     Mouth: Mucous membranes are moist.     Pharynx: Posterior oropharyngeal erythema present. No oropharyngeal exudate.  Eyes:     Conjunctiva/sclera: Conjunctivae normal.     Pupils: Pupils are equal, round, and reactive to light.     Comments: Left upper eyelid erythematous and edematous diffusely  Cardiovascular:     Rate and Rhythm: Normal rate and regular rhythm.  Pulmonary:     Effort: Pulmonary effort is normal. No  respiratory distress.     Breath sounds: No wheezing or rales.  Musculoskeletal:        General: Normal range of motion.     Cervical back: Normal range of motion and neck supple.  Lymphadenopathy:     Cervical: No cervical adenopathy.  Skin:    General: Skin is warm and dry.  Neurological:     Mental Status: He is alert and oriented to person, place, and time.     Motor: No weakness.     Gait: Gait normal.  Psychiatric:        Behavior: Behavior normal.      UC Treatments / Results  Labs (all labs ordered are listed, but only abnormal results are displayed) Labs Reviewed  SARS CORONAVIRUS 2 (TAT 6-24 HRS)    EKG   Radiology No results found.  Procedures Procedures (including critical care time)  Medications Ordered in UC Medications - No data to display  Initial Impression / Assessment and Plan / UC Course  I have reviewed the triage vital signs and the nursing notes.  Pertinent labs & imaging results that were available during my care of the patient were reviewed by me and considered in my medical decision making (see chart for details).     COVID testing pending, discussed supportive over-the-counter medications and home care for this.  Regarding the eyelid swelling, suspect bacterial infection.  Treat with erythromycin ointment, warm compresses, avoidance of any irritating products to the face. Final Clinical Impressions(s) / UC Diagnoses   Final diagnoses:  Blepharitis of left upper eyelid, unspecified type  Viral URI   Discharge Instructions   None    ED Prescriptions     Medication Sig Dispense Auth. Provider   erythromycin ophthalmic ointment Place a 1/2 inch ribbon of ointment into the left lower eyelid BID prn. 3.5 g Particia Nearing, PA-C      PDMP not reviewed this encounter.   Particia Nearing, [place] 12/04/22 1457

## 2022-12-05 LAB — SARS CORONAVIRUS 2 (TAT 6-24 HRS): SARS Coronavirus 2: NEGATIVE

## 2023-02-15 ENCOUNTER — Other Ambulatory Visit: Payer: Self-pay

## 2023-02-15 ENCOUNTER — Encounter (HOSPITAL_COMMUNITY): Payer: Self-pay

## 2023-02-15 ENCOUNTER — Emergency Department (EMERGENCY_DEPARTMENT_HOSPITAL)
Admission: EM | Admit: 2023-02-15 | Discharge: 2023-02-16 | Disposition: A | Discharge status: Other Acute Inpatient Hospital | Payer: BC Managed Care – PPO | Source: Home / Self Care | Attending: Emergency Medicine | Admitting: Emergency Medicine

## 2023-02-15 DIAGNOSIS — R944 Abnormal results of kidney function studies: Secondary | ICD-10-CM | POA: Insufficient documentation

## 2023-02-15 DIAGNOSIS — F314 Bipolar disorder, current episode depressed, severe, without psychotic features: Secondary | ICD-10-CM | POA: Diagnosis not present

## 2023-02-15 DIAGNOSIS — K5900 Constipation, unspecified: Secondary | ICD-10-CM | POA: Diagnosis not present

## 2023-02-15 DIAGNOSIS — Z818 Family history of other mental and behavioral disorders: Secondary | ICD-10-CM | POA: Diagnosis not present

## 2023-02-15 DIAGNOSIS — Z20822 Contact with and (suspected) exposure to covid-19: Secondary | ICD-10-CM | POA: Insufficient documentation

## 2023-02-15 DIAGNOSIS — F319 Bipolar disorder, unspecified: Secondary | ICD-10-CM | POA: Diagnosis not present

## 2023-02-15 DIAGNOSIS — R9431 Abnormal electrocardiogram [ECG] [EKG]: Secondary | ICD-10-CM | POA: Diagnosis not present

## 2023-02-15 DIAGNOSIS — F332 Major depressive disorder, recurrent severe without psychotic features: Secondary | ICD-10-CM | POA: Diagnosis not present

## 2023-02-15 DIAGNOSIS — Z560 Unemployment, unspecified: Secondary | ICD-10-CM | POA: Diagnosis not present

## 2023-02-15 DIAGNOSIS — Z62810 Personal history of physical and sexual abuse in childhood: Secondary | ICD-10-CM | POA: Diagnosis not present

## 2023-02-15 DIAGNOSIS — R45851 Suicidal ideations: Secondary | ICD-10-CM | POA: Diagnosis not present

## 2023-02-15 DIAGNOSIS — R7303 Prediabetes: Secondary | ICD-10-CM | POA: Diagnosis not present

## 2023-02-15 DIAGNOSIS — F411 Generalized anxiety disorder: Secondary | ICD-10-CM | POA: Diagnosis not present

## 2023-02-15 HISTORY — DX: Anxiety disorder, unspecified: F41.9

## 2023-02-15 HISTORY — DX: Depression, unspecified: F32.A

## 2023-02-15 LAB — CBC
HCT: 44 % (ref 39.0–52.0)
Hemoglobin: 15.4 g/dL (ref 13.0–17.0)
MCH: 30.1 pg (ref 26.0–34.0)
MCHC: 35 g/dL (ref 30.0–36.0)
MCV: 86.1 fL (ref 80.0–100.0)
Platelets: 251 10*3/uL (ref 150–400)
RBC: 5.11 MIL/uL (ref 4.22–5.81)
RDW: 11.9 % (ref 11.5–15.5)
WBC: 8.5 10*3/uL (ref 4.0–10.5)
nRBC: 0 % (ref 0.0–0.2)

## 2023-02-15 LAB — COMPREHENSIVE METABOLIC PANEL
ALT: 23 U/L (ref 0–44)
AST: 23 U/L (ref 15–41)
Albumin: 4.3 g/dL (ref 3.5–5.0)
Alkaline Phosphatase: 67 U/L (ref 38–126)
Anion gap: 13 (ref 5–15)
BUN: 15 mg/dL (ref 6–20)
CO2: 25 mmol/L (ref 22–32)
Calcium: 9.6 mg/dL (ref 8.9–10.3)
Chloride: 101 mmol/L (ref 98–111)
Creatinine, Ser: 1.4 mg/dL — ABNORMAL HIGH (ref 0.61–1.24)
GFR, Estimated: >60 mL/min (ref >60)
Glucose, Bld: 114 mg/dL — ABNORMAL HIGH (ref 70–99)
Potassium: 3.7 mmol/L (ref 3.5–5.1)
Sodium: 139 mmol/L (ref 135–145)
Total Bilirubin: 0.9 mg/dL (ref 0.3–1.2)
Total Protein: 7.1 g/dL (ref 6.5–8.1)

## 2023-02-15 LAB — RAPID URINE DRUG SCREEN, HOSP PERFORMED
Amphetamines: NOT DETECTED
Barbiturates: NOT DETECTED
Benzodiazepines: NOT DETECTED
Cocaine: NOT DETECTED
Opiates: NOT DETECTED
Tetrahydrocannabinol: NOT DETECTED

## 2023-02-15 LAB — ETHANOL: Alcohol, Ethyl (B): <10 mg/dL (ref <10)

## 2023-02-15 LAB — ACETAMINOPHEN LEVEL: Acetaminophen (Tylenol), Serum: <10 ug/mL — ABNORMAL LOW (ref 10–30)

## 2023-02-15 LAB — SALICYLATE LEVEL: Salicylate Lvl: <7 mg/dL — ABNORMAL LOW (ref 7.0–30.0)

## 2023-02-15 MED ORDER — ONDANSETRON 4 MG PO TBDP
4.0000 mg | ORAL_TABLET | Freq: Once | ORAL | Status: AC
  Administered 2023-02-15: 4 mg via ORAL
  Filled 2023-02-15: qty 1

## 2023-02-15 NOTE — ED Triage Notes (Signed)
Pt reports he has been feeling suicidal for the last 3 weeks. Hx of bipolar and he states he has been compliant with his meds. He reports he has self-harmed three times in the last 3 weeks: he took 50 Hydroxyzine tabs (did not come to hospital), he cut himself with a knife which caused a superficial wound to his chest (last week, did not come to hospital) and then today he reports he almost [place] a [place]. He denies illegal drugs/alcohol use and denies A/H hallucinations.

## 2023-02-16 ENCOUNTER — Other Ambulatory Visit: Payer: Self-pay

## 2023-02-16 ENCOUNTER — Encounter (HOSPITAL_COMMUNITY): Payer: Self-pay | Admitting: Nurse Practitioner

## 2023-02-16 ENCOUNTER — Inpatient Hospital Stay (HOSPITAL_COMMUNITY)
Admission: AD | Admit: 2023-02-16 | Discharge: 2023-02-23 | DRG: 885 | Disposition: A | Discharge status: Home / Self Care | Payer: BC Managed Care – PPO | Source: Intra-hospital | Attending: Psychiatry | Admitting: Psychiatry

## 2023-02-16 DIAGNOSIS — F332 Major depressive disorder, recurrent severe without psychotic features: Secondary | ICD-10-CM | POA: Diagnosis not present

## 2023-02-16 DIAGNOSIS — Z818 Family history of other mental and behavioral disorders: Secondary | ICD-10-CM | POA: Diagnosis not present

## 2023-02-16 DIAGNOSIS — F329 Major depressive disorder, single episode, unspecified: Principal | ICD-10-CM | POA: Diagnosis present

## 2023-02-16 DIAGNOSIS — Z20822 Contact with and (suspected) exposure to covid-19: Secondary | ICD-10-CM | POA: Diagnosis present

## 2023-02-16 DIAGNOSIS — R944 Abnormal results of kidney function studies: Secondary | ICD-10-CM | POA: Diagnosis present

## 2023-02-16 DIAGNOSIS — Z62810 Personal history of physical and sexual abuse in childhood: Secondary | ICD-10-CM | POA: Diagnosis not present

## 2023-02-16 DIAGNOSIS — F314 Bipolar disorder, current episode depressed, severe, without psychotic features: Principal | ICD-10-CM | POA: Diagnosis present

## 2023-02-16 DIAGNOSIS — R45851 Suicidal ideations: Secondary | ICD-10-CM | POA: Diagnosis not present

## 2023-02-16 DIAGNOSIS — F411 Generalized anxiety disorder: Secondary | ICD-10-CM | POA: Diagnosis present

## 2023-02-16 DIAGNOSIS — K5900 Constipation, unspecified: Secondary | ICD-10-CM | POA: Diagnosis present

## 2023-02-16 DIAGNOSIS — R7303 Prediabetes: Secondary | ICD-10-CM | POA: Diagnosis present

## 2023-02-16 DIAGNOSIS — Z560 Unemployment, unspecified: Secondary | ICD-10-CM

## 2023-02-16 LAB — RESP PANEL BY RT-PCR (RSV, FLU A&B, COVID)  RVPGX2
Influenza A by PCR: NEGATIVE
Influenza B by PCR: NEGATIVE
Resp Syncytial Virus by PCR: NEGATIVE
SARS Coronavirus 2 by RT PCR: NEGATIVE

## 2023-02-16 LAB — LITHIUM LEVEL: Lithium Lvl: 0.11 mmol/L — ABNORMAL LOW (ref 0.60–1.20)

## 2023-02-16 MED ORDER — ACETAMINOPHEN 325 MG PO TABS
650.0000 mg | ORAL_TABLET | Freq: Four times a day (QID) | ORAL | Status: DC | PRN
  Filled 2023-02-16: qty 2

## 2023-02-16 MED ORDER — LITHIUM CARBONATE ER 450 MG PO TBCR
450.0000 mg | EXTENDED_RELEASE_TABLET | Freq: Every day | ORAL | Status: DC
Start: 2023-02-16 — End: 2023-02-16
  Filled 2023-02-16: qty 1

## 2023-02-16 MED ORDER — HALOPERIDOL LACTATE 5 MG/ML IJ SOLN: 5.0000 mg | Freq: Three times a day (TID) | INTRAMUSCULAR | Status: DC | PRN

## 2023-02-16 MED ORDER — LORAZEPAM 2 MG/ML IJ SOLN: 2.0000 mg | Freq: Three times a day (TID) | INTRAMUSCULAR | Status: DC | PRN

## 2023-02-16 MED ORDER — ALUM & MAG HYDROXIDE-SIMETH 200-200-20 MG/5ML PO SUSP: 30.0000 mL | ORAL | Status: DC | PRN

## 2023-02-16 MED ORDER — LORAZEPAM 1 MG PO TABS: 2.0000 mg | ORAL_TABLET | Freq: Three times a day (TID) | ORAL | Status: DC | PRN

## 2023-02-16 MED ORDER — LITHIUM CARBONATE ER 450 MG PO TBCR
450.0000 mg | EXTENDED_RELEASE_TABLET | Freq: Every day | ORAL | Status: DC
  Administered 2023-02-16: 450 mg via ORAL
  Filled 2023-02-16 (×4): qty 1

## 2023-02-16 MED ORDER — LURASIDONE HCL 40 MG PO TABS
80.0000 mg | ORAL_TABLET | Freq: Every day | ORAL | Status: DC
  Filled 2023-02-16 (×2): qty 2

## 2023-02-16 MED ORDER — DIPHENHYDRAMINE HCL 25 MG PO CAPS: 50.0000 mg | ORAL_CAPSULE | Freq: Three times a day (TID) | ORAL | Status: DC | PRN

## 2023-02-16 MED ORDER — MAGNESIUM HYDROXIDE 400 MG/5ML PO SUSP
30.0000 mL | Freq: Every day | ORAL | Status: DC | PRN
  Administered 2023-02-16 – 2023-02-17 (×2): 30 mL via ORAL
  Filled 2023-02-16 (×2): qty 30

## 2023-02-16 MED ORDER — DIPHENHYDRAMINE HCL 50 MG/ML IJ SOLN: 50.0000 mg | Freq: Three times a day (TID) | INTRAMUSCULAR | Status: DC | PRN

## 2023-02-16 MED ORDER — HYDROXYZINE HCL 25 MG PO TABS
25.0000 mg | ORAL_TABLET | Freq: Three times a day (TID) | ORAL | Status: DC | PRN
  Administered 2023-02-21: 25 mg via ORAL
  Filled 2023-02-16: qty 1

## 2023-02-16 MED ORDER — TRAZODONE HCL 50 MG PO TABS: 50.0000 mg | ORAL_TABLET | Freq: Every evening | ORAL | Status: DC | PRN

## 2023-02-16 MED ORDER — HALOPERIDOL 5 MG PO TABS: 5.0000 mg | ORAL_TABLET | Freq: Three times a day (TID) | ORAL | Status: DC | PRN

## 2023-02-16 MED ORDER — LURASIDONE HCL 80 MG PO TABS
80.0000 mg | ORAL_TABLET | Freq: Every day | ORAL | Status: DC
  Administered 2023-02-16 – 2023-02-22 (×7): 80 mg via ORAL
  Filled 2023-02-16 (×9): qty 1

## 2023-02-16 NOTE — ED Notes (Signed)
Pt's wife called and expressed frustration d/t no one having called her to update her or to get collateral information. Explained to here that per Chana Bode NP's note it says that a phone call was attempted w/ no answer. Verified the phone number that was called is the pt's wife's phone number. Messaged Chana Bode NP regarding wife's frustrations and asking for a phone call for updates.

## 2023-02-16 NOTE — ED Notes (Signed)
Resp panel sent to lab at this time

## 2023-02-16 NOTE — Progress Notes (Signed)
The patient rated his day as a 6 out of 10 since today was his first day in the hospital. His positive event for the day is that his wife came in to visit.

## 2023-02-16 NOTE — Progress Notes (Signed)
Pt was accepted to [facility] 02/16/2023, pending covid PCR and signed voluntary consent faxed to [phone]. Bed assignment: [id number]  Pt meets inpatient criteria per Vesta Mixer, NP  Attending Physician will be Janine Limbo, MD  Report can be called to: - Adult unit: [phone]  Pt can arrive after pending items are received; [facility] to coordinate arrival time  Care Team Notified: [facility] Lynnda Shields, RN, Vesta Mixer, NP, and Will Hilma Favors, RN  [place], [place]  02/16/2023 11:55 AM

## 2023-02-16 NOTE — ED Notes (Signed)
Voluntary at this time. 

## 2023-02-16 NOTE — ED Provider Notes (Signed)
Emergency Medicine Observation Re-evaluation Note  Keith Gilbert is a 47 y.o. male bipolar 1, seen on rounds today.  Pt initially presented to the ED for complaints of Suicidal Currently, the patient is calm without any complaints.  Physical Exam  BP (!) 155/102   Pulse 76   Temp 98 F (36.7 C)   Resp 16   Ht 6' (1.829 m)   Wt 97.5 kg   SpO2 97%   BMI 29.16 kg/m  Physical Exam General: Talking on phone Lungs: Normal work of breathing Psych: Calm  ED Course / MDM  EKG:EKG Interpretation  Date/Time:  Tuesday February 16 2023 00:05:57 EDT Ventricular Rate:  79 PR Interval:  174 QRS Duration: 90 QT Interval:  368 QTC Calculation: 421 R Axis:   66 Text Interpretation: Normal sinus rhythm Confirmed by Palumbo, April ([id number]) on 02/16/2023 12:21:13 AM  I have reviewed the labs performed to date as well as medications administered while in observation.  Recent changes in the last 24 hours include seen by psychiatry who recommends inpatient treatment.  Plan  Current plan is for placement.    Fransico Meadow, MD 02/16/23 [phone]
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
## 2023-02-16 NOTE — Progress Notes (Signed)
   02/16/23 2300  Psych Admission Type (Psych Patients Only)  Admission Status Voluntary  Psychosocial Assessment  Patient Complaints Anxiety;Depression  Eye Contact Fair  Facial Expression Flat;Sad;Wide-eyed  Affect Depressed;Sad  Speech Logical/coherent  Interaction Minimal;Guarded  Motor Activity Slow  Appearance/Hygiene In scrubs  Behavior Characteristics Cooperative;Unable to participate  Mood Depressed;Anxious;Pleasant  Thought Process  Coherency WDL  Content Blaming self  Delusions None reported or observed  Perception WDL  Hallucination None reported or observed  Judgment WDL  Confusion None  Danger to Self  Current suicidal ideation? Passive  Self-Injurious Behavior No self-injurious ideation or behavior indicators observed or expressed   Agreement Not to Harm Self Yes  Description of Agreement verbal  Danger to Others  Danger to Others None reported or observed

## 2023-02-16 NOTE — ED Notes (Signed)
Called [facility],to be met at ambulance bay, and will call RN upon arrival.

## 2023-02-16 NOTE — ED Notes (Signed)
Notified Network engineer to set up safe transport

## 2023-02-16 NOTE — ED Notes (Signed)
TTS in process 

## 2023-02-16 NOTE — ED Notes (Signed)
Pt was ambulated out to safe transport by his sitter. Belongings and paperwork given to sitter and sitter is to give them to safe transport. Pt A&Ox4, VSS and ambulatory w/ steady gait upon departure.
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
## 2023-02-16 NOTE — ED Provider Notes (Signed)
[facility] Provider Note   CSN: [id number] Arrival date & time: 02/15/23  2231     History  Chief Complaint  Patient presents with   Suicidal    Keith Gilbert is a 47 y.o. male.  47 year old male with a history of bipolar 1 disorder presents to the emergency department for suicidal ideations.  He has been experiencing suicidal ideations for the past 3 weeks.  Last hospitalization for behavioral health reasons was in 2022 in [place].  He did communicate to his psychiatrist that he had been having suicidal thoughts, but did not disclose his suicide attempts.  His doctor tried to augment his medication, but the patient does not feel that it is helping.  Took 50 tablets of hydroxyzine approximately 3 weeks ago and an attempt for suicide.  Also caused a superficial cut to his chest when he had thoughts of stabbing himself.  He does own firearms, but wife states that she is the only one with access to them currently.  Patient denies alcohol and illicit drug use.  The history is provided by the patient. No language interpreter was used.       Home Medications Prior to Admission medications   Medication Sig Start Date End Date Taking? Authorizing Provider  Coenzyme Q10 300 MG CAPS Take by mouth. 05/31/21   [provider]  Efinaconazole 10 % SOLN Apply 1 drop topically daily. 08/20/22   Bronson Ing, DPM  erythromycin ophthalmic ointment Place a 1/2 inch ribbon of ointment into the left lower eyelid BID prn. 12/04/22   Volney American, PA-C  lurasidone (LATUDA) 40 MG TABS tablet Take 60 mg by mouth daily with breakfast.    [provider]  Multiple Vitamin (MULTIVITAMIN) tablet Take 1 tablet by mouth daily.    [provider]  sildenafil (VIAGRA) 100 MG tablet Take 1 tablet (100 mg total) by mouth daily as needed for erectile dysfunction. 10/19/22   Stoneking, Reece Leader., MD      Allergies    Morphine,  Lactose intolerance (gi), and Minocycline    Review of Systems   Review of Systems Ten systems reviewed and are negative for acute change, except as noted in the HPI.    Physical Exam Updated Vital Signs BP (!) 155/102   Pulse 76   Temp 98 F (36.7 C)   Resp 16   Ht 6' (1.829 m)   Wt 97.5 kg   SpO2 97%   BMI 29.16 kg/m   Physical Exam Vitals and nursing note reviewed.  Constitutional:      General: He is not in acute distress.    Appearance: He is well-developed. He is not diaphoretic.  HENT:     Head: Normocephalic and atraumatic.  Eyes:     General: No scleral icterus.    Conjunctiva/sclera: Conjunctivae normal.  Pulmonary:     Effort: Pulmonary effort is normal. No respiratory distress.  Musculoskeletal:        General: Normal range of motion.     Cervical back: Normal range of motion.  Skin:    General: Skin is warm and dry.     Coloration: Skin is not pale.     Findings: No erythema or rash.  Neurological:     Mental Status: He is alert and oriented to person, place, and time.  Psychiatric:        Mood and Affect: Mood is depressed. Affect is flat.  Speech: Speech normal.        Behavior: Behavior is cooperative.        Thought Content: Thought content includes suicidal ideation. Thought content includes suicidal plan.     ED Results / Procedures / Treatments   Labs (all labs ordered are listed, but only abnormal results are displayed) Labs Reviewed  COMPREHENSIVE METABOLIC PANEL - Abnormal; Notable for the following components:      Result Value   Glucose, Bld 114 (*)    Creatinine, Ser 1.40 (*)    All other components within normal limits  SALICYLATE LEVEL - Abnormal; Notable for the following components:   Salicylate Lvl [id number] (*)    All other components within normal limits  ACETAMINOPHEN LEVEL - Abnormal; Notable for the following components:   Acetaminophen (Tylenol), Serum <10 (*)    All other components within normal limits  LITHIUM  LEVEL - Abnormal; Notable for the following components:   Lithium Lvl 0.11 (*)    All other components within normal limits  ETHANOL  CBC  RAPID URINE DRUG SCREEN, HOSP PERFORMED    EKG EKG Interpretation  Date/Time:  Tuesday February 16 2023 00:05:57 EDT Ventricular Rate:  79 PR Interval:  174 QRS Duration: 90 QT Interval:  368 QTC Calculation: 421 R Axis:   66 Text Interpretation: Normal sinus rhythm Confirmed by Randal Buba, April ([id number]) on 02/16/2023 12:21:13 AM  Radiology No results found.  Procedures Procedures    Medications Ordered in ED Medications  lurasidone (LATUDA) tablet 80 mg (80 mg Oral Patient Refused/Not Given 02/16/23 0210)  lithium carbonate (ESKALITH) ER tablet 450 mg (450 mg Oral Patient Refused/Not Given 02/16/23 0210)  ondansetron (ZOFRAN-ODT) disintegrating tablet 4 mg (4 mg Oral Given 02/15/23 2310)    ED Course/ Medical Decision Making/ A&P                             Medical Decision Making Amount and/or Complexity of Data Reviewed Labs: ordered.  Risk Prescription drug management.   This patient presents to the ED for concern of suicidal ideations, this involves an extensive number of treatment options, and is a complaint that carries with it a high risk of complications and morbidity.    Co morbidities that complicate the patient evaluation  Bipolar 1 disorder   Additional history obtained:  Additional history obtained from wife, at bedside   Lab Tests:  I Ordered, and personally interpreted labs.  The pertinent results include:  Creatinine 1.4 (baseline ~1.1-1.25). Lithium 0.11.   Cardiac Monitoring:  The patient was maintained on a cardiac monitor.  I personally viewed and interpreted the cardiac monitored which showed an underlying rhythm of: NSR   Medicines ordered and prescription drug management:  I ordered medication including Lithium and Latuda for medication compliance  Reevaluation of the patient after these  medicines showed that the patient stayed the same I have reviewed the patients home medicines and have made adjustments as needed   Consultations Obtained:  I requested consultation with TTS - Evette Georges, NP, recommends observation for safety with reassessment in the AM    Reevaluation:  After the interventions noted above, I reevaluated the patient and found that they have :stayed the same   Social Determinants of Health:  Good social support.  Wife at bedside.   Dispostion:  Patient pending reassessment in the morning by psychiatry.  Disposition to be determined by oncoming ED provider.  Final Clinical Impression(s) / ED Diagnoses Final diagnoses:  Suicidal ideation    Rx / DC Orders ED Discharge Orders     None         Antonietta Breach, PA-C 02/16/23 [id number]    Palumbo, April, MD 02/17/23 [id number]

## 2023-02-16 NOTE — ED Notes (Signed)
Notified Network engineer to fax voluntary consent to [facility]

## 2023-02-17 ENCOUNTER — Encounter (HOSPITAL_COMMUNITY): Payer: Self-pay

## 2023-02-17 DIAGNOSIS — F332 Major depressive disorder, recurrent severe without psychotic features: Secondary | ICD-10-CM | POA: Diagnosis not present

## 2023-02-17 LAB — LITHIUM LEVEL: Lithium Lvl: 0.23 mmol/L — ABNORMAL LOW (ref 0.60–1.20)

## 2023-02-17 MED ORDER — LITHIUM CARBONATE ER 450 MG PO TBCR
450.0000 mg | EXTENDED_RELEASE_TABLET | Freq: Two times a day (BID) | ORAL | Status: DC
  Administered 2023-02-17: 450 mg via ORAL
  Filled 2023-02-17 (×7): qty 1

## 2023-02-17 NOTE — [facility] Group Notes (Signed)
[facility] Group Notes:  (Nursing/MHT/Case Management/Adjunct)  Date:  02/17/2023  Time:  9:20 PM  Type of Therapy:  Group Therapy  Participation Level:  Active  Participation Quality:  Appropriate  Affect:  Appropriate  Cognitive:  Appropriate  Insight:  Appropriate  Engagement in Group:  Engaged  Modes of Intervention:  Education  Summary of Progress/Problems: Participated in anger management exercise. Reports day was 6.5/10 better day.  Orvan Falconer 02/17/2023, 9:20 PM
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
## 2023-02-17 NOTE — [facility] Suicide Risk Assessment (Signed)
[place] INPATIENT:  Family/Significant Other Suicide Prevention Education  Suicide Prevention Education:  Education Completed; Deforrest Hickingbottom [phone] (Wife) has been identified by the patient as the family member/significant other with whom the patient will be residing, and identified as the person(s) who will aid the patient in the event of a mental health crisis (suicidal ideations/suicide attempt).  With written consent from the patient, the family member/significant other has been provided the following suicide prevention education, prior to the and/or following the discharge of the patient.  The suicide prevention education provided includes the following: Suicide risk factors Suicide prevention and interventions National Suicide Hotline telephone number [facility] assessment telephone number [facility] Emergency Assistance Encampment and/or Residential Mobile Crisis Unit telephone number  Request made of family/significant other to: Remove weapons (e.g., guns, rifles, knives), all items previously/currently identified as safety concern.   Remove drugs/medications (over-the-counter, prescriptions, illicit drugs), all items previously/currently identified as a safety concern.  The family member/significant other verbalizes understanding of the suicide prevention education information provided.  The family member/significant other agrees to remove the items of safety concern listed above.  CSW spoke with Mrs. Lanson who confirms that her husband can return home after discharge.  She states that there are no firearms or weapons in the home and that "those items have been removed and placed in a safe at another home".  She states that she would like [facility] for his PHP follow up and states that they will no longer be seeing his virtual psychiatrist due to hiim not being within their insurance network.  She states that her husband still has his job and "has not  quit or been fired".  She states that she is working on [employer] for him and states that he "can keep his job if he wants too".  Mrs. Sera reports that her husband "is Lactose Intolerant and needs his Lactaid pills daily".  She states "he cannot focus on his mental health if his stomach hurts".  She reports that she has these pills at home and can bring them to the hospital if needed.  Mrs. Mercadel states "I do not want of my husband's medications changed without the doctor having a discussion with me first because I need time to research about side effects".  CSW completed SPE with Mrs. Coit.   Frutoso Chase Etherine Mackowiak 02/17/2023, 10:20 AM

## 2023-02-17 NOTE — Progress Notes (Addendum)
Pt reported passive SI this morning, no lethal plan expressed at this time, pt verbally contracts for safety. Pt complains of a stomach ache this morning r/t lactose intolerance. Pt reports that he is having a difficult time passing stool due to lactose intolerance. RN provided pt with Milk of Magnesia and a pitcher of water. Following lunch pt reported that his stomach felt a little bit better. Pt has remained in bed in his room for most of the day.    02/17/23 1000  Psych Admission Type (Psych Patients Only)  Admission Status Voluntary  Psychosocial Assessment  Patient Complaints Anxiety;Depression  Eye Contact Fair  Facial Expression Wide-eyed;Flat  Affect Depressed;Sad  Speech Logical/coherent  Interaction Minimal;Guarded  Motor Activity Slow  Appearance/Hygiene In scrubs  Behavior Characteristics Cooperative;Appropriate to situation  Mood Depressed;Anxious;Pleasant  Thought Process  Coherency WDL  Content WDL  Delusions None reported or observed  Perception WDL  Hallucination None reported or observed  Judgment WDL  Confusion None  Danger to Self  Current suicidal ideation? Passive  Self-Injurious Behavior Some self-injurious ideation observed or expressed.  No lethal plan expressed   Agreement Not to Harm Self Yes  Description of Agreement Verbal  Danger to Others  Danger to Others None reported or observed  Danger to Others Abnormal  Harmful Behavior to others No threats or harm toward other people  Destructive Behavior No threats or harm toward property

## 2023-02-17 NOTE — [facility] Group Notes (Signed)
[facility] Group Notes:  (Nursing/MHT/Case Management/Adjunct)  Date:  02/17/2023  Time:  9:23 AM  Type of Therapy: Group Topic/ Focus: Goals Group: The focus of this group is to help patients establish daily goals to achieve during treatment and discuss how the patient can incorporate goal setting into their daily lives to aide in recovery.   Participation Level:  Did Not Attend  Summary of Progress/Problems:  Patient did not attend orientation/ goals group today.   Elza Rafter 02/17/2023, 9:23 AM

## 2023-02-17 NOTE — Progress Notes (Signed)
   02/17/23 2300  Psych Admission Type (Psych Patients Only)  Admission Status Voluntary  Psychosocial Assessment  Patient Complaints Anxiety;Depression  Eye Contact Fair  Facial Expression Wide-eyed;Flat  Affect Depressed;Sad  Speech Logical/coherent  Interaction Minimal;Guarded  Motor Activity Slow  Appearance/Hygiene In scrubs  Behavior Characteristics Cooperative;Appropriate to situation  Mood Depressed;Anxious;Pleasant  Thought Process  Coherency WDL  Content WDL  Delusions None reported or observed  Perception WDL  Hallucination None reported or observed  Judgment WDL  Confusion None  Danger to Self  Current suicidal ideation? Passive  Self-Injurious Behavior Some self-injurious ideation observed or expressed.  No lethal plan expressed   Agreement Not to Harm Self Yes  Description of Agreement verbal  Danger to Others  Danger to Others None reported or observed  Danger to Others Abnormal  Harmful Behavior to others No threats or harm toward other people  Destructive Behavior No threats or harm toward property
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
## 2023-02-17 NOTE — BH IP Treatment Plan (Signed)
Interdisciplinary Treatment and Diagnostic Plan Update  02/17/2023 Time of Session: 9:35am  Keith Gilbert MRN: [id number]  Principal Diagnosis: MDD (major depressive disorder)  Secondary Diagnoses: Principal Problem:   MDD (major depressive disorder)   Current Medications:  Current Facility-Administered Medications  Medication Dose Route Frequency Provider Last Rate Last Admin   acetaminophen (TYLENOL) tablet 650 mg  650 mg Oral Q6H PRN Vesta Mixer, NP       alum & mag hydroxide-simeth (MAALOX/MYLANTA) 200-200-20 MG/5ML suspension 30 mL  30 mL Oral Q4H PRN Vesta Mixer, NP       diphenhydrAMINE (BENADRYL) capsule 50 mg  50 mg Oral TID PRN Vesta Mixer, NP       Or   diphenhydrAMINE (BENADRYL) injection 50 mg  50 mg Intramuscular TID PRN Vesta Mixer, NP       haloperidol (HALDOL) tablet 5 mg  5 mg Oral TID PRN Vesta Mixer, NP       Or   haloperidol lactate (HALDOL) injection 5 mg  5 mg Intramuscular TID PRN Vesta Mixer, NP       hydrOXYzine (ATARAX) tablet 25 mg  25 mg Oral TID PRN Vesta Mixer, NP       lithium carbonate (ESKALITH) ER tablet 450 mg  450 mg Oral QHS Vesta Mixer, NP   450 mg at 02/16/23 2158   LORazepam (ATIVAN) tablet 2 mg  2 mg Oral TID PRN Vesta Mixer, NP       Or   LORazepam (ATIVAN) injection 2 mg  2 mg Intramuscular TID PRN Vesta Mixer, NP       lurasidone (LATUDA) tablet 80 mg  80 mg Oral QAC supper Vesta Mixer, NP   80 mg at 02/16/23 1824   magnesium hydroxide (MILK OF MAGNESIA) suspension 30 mL  30 mL Oral Daily PRN Vesta Mixer, NP   30 mL at 02/17/23 1001   traZODone (DESYREL) tablet 50 mg  50 mg Oral QHS PRN Vesta Mixer, NP       PTA Medications: Medications Prior to Admission  Medication Sig Dispense Refill Last Dose   Efinaconazole 10 % SOLN Apply 1 drop topically daily. (Patient not taking: Reported on 02/16/2023) 4 mL 2    erythromycin ophthalmic ointment Place a 1/2 inch ribbon of ointment  into the left lower eyelid BID prn. (Patient not taking: Reported on 02/16/2023) 3.5 g 0    lithium carbonate (ESKALITH) 450 MG ER tablet Take 450 mg by mouth at bedtime.      lurasidone (LATUDA) 80 MG TABS tablet Take 80 mg by mouth at bedtime.      Multiple Vitamin (MULTIVITAMIN) tablet Take 1 tablet by mouth daily.      sildenafil (VIAGRA) 100 MG tablet Take 1 tablet (100 mg total) by mouth daily as needed for erectile dysfunction. (Patient not taking: Reported on 02/16/2023) 10 tablet 11     Patient Stressors: Financial difficulties   Occupational concerns   Traumatic event    Patient Strengths: Capable of independent living  Armed forces logistics/support/administrative officer  Physical Health  Religious Affiliation  Supportive family/friends  Work skills   Treatment Modalities: Medication Management, Group therapy, Case management,  1 to 1 session with clinician, Psychoeducation, Recreational therapy.   Physician Treatment Plan for Primary Diagnosis: MDD (major depressive disorder) Long Term Goal(s):     Short Term Goals:    Medication Management: Evaluate patient's response, side effects, and tolerance of medication regimen.  Therapeutic Interventions: 1 to 1 sessions, Unit Group sessions and Medication administration.  Evaluation of Outcomes: Not Met  Physician Treatment Plan for Secondary Diagnosis: Principal Problem:   MDD (major depressive disorder)  Long Term Goal(s):     Short Term Goals:       Medication Management: Evaluate patient's response, side effects, and tolerance of medication regimen.  Therapeutic Interventions: 1 to 1 sessions, Unit Group sessions and Medication administration.  Evaluation of Outcomes: Not Met   RN Treatment Plan for Primary Diagnosis: MDD (major depressive disorder) Long Term Goal(s): Knowledge of disease and therapeutic regimen to maintain health will improve  Short Term Goals: Ability to remain free from injury will improve, Ability to participate in  decision making will improve, Ability to verbalize feelings will improve, Ability to disclose and discuss suicidal ideas, and Ability to identify and develop effective coping behaviors will improve  Medication Management: RN will administer medications as ordered by provider, will assess and evaluate patient's response and provide education to patient for prescribed medication. RN will report any adverse and/or side effects to prescribing provider.  Therapeutic Interventions: 1 on 1 counseling sessions, Psychoeducation, Medication administration, Evaluate responses to treatment, Monitor vital signs and CBGs as ordered, Perform/monitor CIWA, COWS, AIMS and Fall Risk screenings as ordered, Perform wound care treatments as ordered.  Evaluation of Outcomes: Not Met   LCSW Treatment Plan for Primary Diagnosis: MDD (major depressive disorder) Long Term Goal(s): Safe transition to appropriate next level of care at discharge, Engage patient in therapeutic group addressing interpersonal concerns.  Short Term Goals: Engage patient in aftercare planning with referrals and resources, Increase social support, Increase emotional regulation, Facilitate acceptance of mental health diagnosis and concerns, Identify triggers associated with mental health/substance abuse issues, and Increase skills for wellness and recovery  Therapeutic Interventions: Assess for all discharge needs, 1 to 1 time with Social worker, Explore available resources and support systems, Assess for adequacy in community support network, Educate family and significant other(s) on suicide prevention, Complete Psychosocial Assessment, Interpersonal group therapy.  Evaluation of Outcomes: Not Met   Progress in Treatment: Attending groups: Yes. Participating in groups: Yes. Taking medication as prescribed: Yes. Toleration medication: Yes. Family/Significant other contact made: Yes, individual(s) contacted:  Wife  Patient understands  diagnosis: Yes. Discussing patient identified problems/goals with staff: Yes. Medical problems stabilized or resolved: Yes. Denies suicidal/homicidal ideation: Yes. Issues/concerns per patient self-inventory: No.   New problem(s) identified: No, Describe:  None   New Short Term/Long Term Goal(s): medication stabilization, elimination of SI thoughts, development of comprehensive mental wellness plan.   Patient Goals: "To feel better and stop having suicidal thoughts"   Discharge Plan or Barriers: Patient recently admitted. CSW will continue to follow and assess for appropriate referrals and possible discharge planning.   Reason for Continuation of Hospitalization: Anxiety Depression Medication stabilization Suicidal ideation  Estimated Length of Stay: 3 to 7 days   Last 3 [place] Suicide Severity Risk Score: Flowsheet Row Admission (Current) from 02/16/2023 in [place] [facility] ED from 02/15/2023 in [facility] [place] [facility] from 12/04/2022 in [facility] [place] High Risk High Risk No Risk       Last PHQ 2/9 Scores:     No data to display          Scribe for Treatment Team: Darleen Crocker, Latanya Presser 02/17/2023 1:23 PM

## 2023-02-17 NOTE — Progress Notes (Signed)
   02/17/23 0615  15 Minute Checks  Location Hallway  Visual Appearance Calm  Behavior Composed  Sleep (Behavioral Health Patients Only)  Calculate sleep? (Click Yes once per 24 hr at 0600 safety check) Yes  Documented sleep last 24 hours 6

## 2023-02-18 MED ORDER — LITHIUM CARBONATE ER 300 MG PO TBCR
300.0000 mg | EXTENDED_RELEASE_TABLET | Freq: Every day | ORAL | Status: DC
  Administered 2023-02-19 – 2023-02-22 (×4): 300 mg via ORAL
  Filled 2023-02-18 (×5): qty 1

## 2023-02-18 MED ORDER — LITHIUM CARBONATE ER 450 MG PO TBCR
450.0000 mg | EXTENDED_RELEASE_TABLET | Freq: Every day | ORAL | Status: DC
  Administered 2023-02-18 – 2023-02-22 (×5): 450 mg via ORAL
  Filled 2023-02-18 (×6): qty 1

## 2023-02-18 MED ORDER — LITHIUM CARBONATE ER 300 MG PO TBCR: 300.0000 mg | EXTENDED_RELEASE_TABLET | Freq: Every day | ORAL | Status: DC

## 2023-02-18 MED ORDER — LITHIUM CARBONATE ER 300 MG PO TBCR
600.0000 mg | EXTENDED_RELEASE_TABLET | Freq: Two times a day (BID) | ORAL | Status: DC
  Filled 2023-02-18 (×2): qty 2

## 2023-02-18 NOTE — Progress Notes (Addendum)
[facility] MD Progress Note  02/18/2023 4:00 PM Keith Gilbert  MRN:  [id number]  Subjective: Keith Gilbert states, " I feel I am making steady progress. I want to feel better each day before I leave the hospital by attending therapeutic milieu, group activities, and taking my medications"  Reason for admission:Keith Gilbert is a 47 year old African-American male with prior psychiatric history significant for major depressive disorder recurrent severe, and bipolar disorder, who presents voluntarily to [facility] from [facility] ED for worsening depressive symptoms resulting in suicidal ideation in the context of losing his job on Monday, 02/15/2023.  Patient was examined and stabilize at [facility], ED prior to admission at [facility] for further evaluation of his mental health disorder and treatment.     24-hour chart review: Patient mostly compliant with scheduled medications, however, refusing lithium on that in a.m. due to complaint of making him sleepy.  Adjusting lithium dosing today, will monitor compliance.  As needed medication of Milk of Magnesia 30 mL given for mild constipation.  Patient visible on the unit and attending groups activities and therapeutic milieu.  Vital signs reviewed with no critical values.  Today's assessment: Keith Gilbert is seen and examined in the office sitting in a chair without acute discomfort.  He is visible on the unit, attending groups activities and therapeutic milieu.  Chart reviewed, findings shared with the treatment team and consult with the attending psychiatrist.  Reports sad mood, however better than during admission.  Affect appears constricted.  Rates depression as 7/10 with 10 being the worst and anxiety at 8/10, with 10 being the worst.  Made patient aware to ask the nursing staff for as needed medication for anxiety.  Reports appetite is fair and he is enjoying the unit activities of going outside and playing ball.  Nursing staff report no  behavioral problems.  Patient reports, he does not prefer taking lithium twice daily, however, only prefers once daily at nighttime due to causing daytime sleepiness.  Discussed with patient and his wife regarding low lithium level of 0.23, both agreed on giving 450 mg p.o. p.m. dosing and 350 mg p.o. at midday.  Will monitor effectiveness and lithium level.  Patient continues to endorse minimal passive SI without plans or intent while at the hospital.  No objective response to internal stimuli during assessment.  Patient denies HI or AVH.  Collaborative information: Patient's wife Keith Gilbert, called at [phone] for further information regarding patient's medications.  Spouse report patient does not take medications, not even Tylenol, however, feels overwhelmed with all the psychotropic medications that he recently started taking.  Reports that she will be visiting patient tonight and will stress the importance of taking lithium as ordered.  Principal Problem: MDD (major depressive disorder)  Diagnosis: Principal Problem:   MDD (major depressive disorder)  Total Time spent with patient: 30 minutes  Past Psychiatric History:   Past Medical History:  Past Medical History:  Diagnosis Date   Anxiety    Bipolar 1 disorder ([place])    Depression     Past Surgical History:  Procedure Laterality Date   None     Family History:  Family History  Problem Relation Age of Onset   Heart disease Mother    Heart disease Father    Family Psychiatric  History: Psych: Maternal side depression Psych Rx: Yes SA/HA: Denies Substance use family hx: Patient denies  Social History:  Social History   Substance and Sexual Activity  Alcohol Use  Yes     Social History   Substance and Sexual Activity  Drug Use Yes   Frequency: 4.0 times per week   Types: Marijuana    Social History   Socioeconomic History   Marital status: Married    Spouse name: Not on file   Number of children: Not  on file   Years of education: Not on file   Highest education level: Not on file  Occupational History   Not on file  Tobacco Use   Smoking status: Never   Smokeless tobacco: Never  Substance and Sexual Activity   Alcohol use: Yes   Drug use: Yes    Frequency: 4.0 times per week    Types: Marijuana   Sexual activity: Yes  Other Topics Concern   Not on file  Social History Narrative   Not on file   Social Determinants of Health   Financial Resource Strain: Not on file  Food Insecurity: Patient Declined (02/16/2023)   Hunger Vital Sign    Worried About Running Out of Food in the Last Year: Patient declined    Lillie in the Last Year: Patient declined  Transportation Needs: No Transportation Needs (02/16/2023)   PRAPARE - Hydrologist (Medical): No    Lack of Transportation (Non-Medical): No  Physical Activity: Not on file  Stress: Not on file  Social Connections: Not on file   Additional Social History:    Sleep: Good  Appetite:  Good  Current Medications: Current Facility-Administered Medications  Medication Dose Route Frequency Provider Last Rate Last Admin   acetaminophen (TYLENOL) tablet 650 mg  650 mg Oral Q6H PRN Vesta Mixer, NP       alum & mag hydroxide-simeth (MAALOX/MYLANTA) 200-200-20 MG/5ML suspension 30 mL  30 mL Oral Q4H PRN Vesta Mixer, NP       diphenhydrAMINE (BENADRYL) capsule 50 mg  50 mg Oral TID PRN Vesta Mixer, NP       Or   diphenhydrAMINE (BENADRYL) injection 50 mg  50 mg Intramuscular TID PRN Vesta Mixer, NP       haloperidol (HALDOL) tablet 5 mg  5 mg Oral TID PRN Vesta Mixer, NP       Or   haloperidol lactate (HALDOL) injection 5 mg  5 mg Intramuscular TID PRN Vesta Mixer, NP       hydrOXYzine (ATARAX) tablet 25 mg  25 mg Oral TID PRN Vesta Mixer, NP       lithium carbonate (ESKALITH) ER tablet 450 mg  450 mg Oral Q12H Kennita Pavlovich C, FNP   450 mg at 02/17/23 2039    LORazepam (ATIVAN) tablet 2 mg  2 mg Oral TID PRN Vesta Mixer, NP       Or   LORazepam (ATIVAN) injection 2 mg  2 mg Intramuscular TID PRN Vesta Mixer, NP       lurasidone (LATUDA) tablet 80 mg  80 mg Oral QAC supper Vesta Mixer, NP   80 mg at 02/17/23 1721   magnesium hydroxide (MILK OF MAGNESIA) suspension 30 mL  30 mL Oral Daily PRN Vesta Mixer, NP   30 mL at 02/17/23 1001   traZODone (DESYREL) tablet 50 mg  50 mg Oral QHS PRN Vesta Mixer, NP        Lab Results:  Results for orders placed or performed during the hospital encounter of 02/16/23 (from the past 48 hour(s))  Lithium level     Status: Abnormal   Collection  Time: 02/17/23  6:36 AM  Result Value Ref Range   Lithium Lvl 0.23 (L) 0.60 - 1.20 mmol/L    Comment: Performed at [facility], [place] [address], [place], [place] [address]    Blood Alcohol level:  Lab Results  Component Value Date   ETH <10 0000000    Metabolic Disorder Labs: No results found for: "HGBA1C", "MPG" No results found for: "PROLACTIN" No results found for: "CHOL", "TRIG", "HDL", "CHOLHDL", "VLDL", "LDLCALC"  Physical Findings: AIMS: Facial and Oral Movements Muscles of Facial Expression: None, normal Lips and Perioral Area: None, normal Jaw: None, normal Tongue: None, normal,Extremity Movements Upper (arms, wrists, hands, fingers): None, normal Lower (legs, knees, ankles, toes): None, normal, Trunk Movements Neck, shoulders, hips: None, normal, Overall Severity Severity of abnormal movements (highest score from questions above): None, normal Incapacitation due to abnormal movements: None, normal Patient's awareness of abnormal movements (rate only patient's report): No Awareness, Dental Status Current problems with teeth and/or dentures?: No Does patient usually wear dentures?: No  CIWA:    COWS:     Musculoskeletal: Strength & Muscle Tone: within normal limits Gait & Station: normal Patient  leans: N/A  Psychiatric Specialty Exam:  Presentation  General Appearance:  Appropriate for Environment; Casual; Fairly Groomed  Eye Contact: Good  Speech: Clear and Coherent; Normal Rate  Speech Volume: Normal  Handedness: Right  Mood and Affect  Mood: Anxious; Depressed  Affect: Congruent  Thought Process  Thought Processes: Coherent; Linear  Descriptions of Associations:Intact  Orientation:Full (Time, Place and Person)  Thought Content:Logical  History of Schizophrenia/Schizoaffective disorder:No  Duration of Psychotic Symptoms:No data recorded Hallucinations:Hallucinations: None  Ideas of Reference:None  Suicidal Thoughts:Suicidal Thoughts: Yes, Passive SI Passive Intent and/or Plan: Without Intent; Without Plan; Without Means to Carry Out  Homicidal Thoughts:Homicidal Thoughts: No  Sensorium  Memory: Immediate Good; Recent Good  Judgment: Fair  Insight: Fair  Executive Functions  Concentration: Good  Attention Span: Good  Recall: Good  Fund of Knowledge: Fair  Language: Good  Psychomotor Activity  Psychomotor Activity: Psychomotor Activity: Normal  Assets  Assets: Communication Skills; Desire for Improvement; Housing; Physical Health; Social Support  Sleep  Sleep: Sleep: Good Number of Hours of Sleep: 7  Physical Exam: Physical Exam Vitals and nursing note reviewed.  HENT:     Head: Normocephalic.     Nose: Nose normal.     Mouth/Throat:     Mouth: Mucous membranes are moist.     Pharynx: Oropharynx is clear.  Eyes:     Conjunctiva/sclera: Conjunctivae normal.     Pupils: Pupils are equal, round, and reactive to light.  Cardiovascular:     Rate and Rhythm: Normal rate.     Pulses: Normal pulses.  Pulmonary:     Effort: Pulmonary effort is normal.  Abdominal:     Palpations: Abdomen is soft.  Genitourinary:    Comments: Deferred Musculoskeletal:        General: Normal range of motion.     Cervical back:  Normal range of motion.  Skin:    General: Skin is warm.  Neurological:     General: No focal deficit present.     Mental Status: He is alert and oriented to person, place, and time.  Psychiatric:        Behavior: Behavior normal.    Review of Systems  Constitutional: Negative.   HENT: Negative.    Eyes: Negative.   Respiratory: Negative.    Cardiovascular: Negative.   Gastrointestinal: Negative.  Genitourinary: Negative.   Musculoskeletal: Negative.   Skin: Negative.   Neurological: Negative.   Endo/Heme/Allergies: Negative.   Psychiatric/Behavioral:  Positive for depression and suicidal ideas. The patient is nervous/anxious.    Blood pressure (!) 125/90, pulse 95, temperature 98.4 F (36.9 C), temperature source Oral, resp. rate 18, height '5\' 10"'$  (1.778 m), weight 98.2 kg, SpO2 95 %. Body mass index is 31.06 kg/m.   Treatment Plan Summary: Daily contact with patient to assess and evaluate symptoms and progress in treatment and Medication management  Observation Level/Precautions:  15 minute checks  Laboratory:  CBC Chemistry Profile HbAIC UDS UA  Psychotherapy: Therapeutic milieu  Medications: See MAR  Consultations: Social work  Discharge Concerns: Safety  Estimated LOS: 5 to 7 days  Other:  N/A    Labs:             CMP: Glucose 114 high, creatinine 1.40 high, otherwise normal             CBC: Within normal limits Lithium level: 0.23 low, increase lithium to 450 mg p.o. twice daily will recheck level 02/22/23 UDS: Negative   EKG: NSR; ventricular rate 79; QT /QTc 368/421   Physician Treatment Plan for Primary Diagnosis:  Assessment MDD (major depressive disorder)   Plan: Medication: --Increase lithium carbonate ER tablets to 600 mg p.o. nightly starting 02/18/2023 --Initiates lithium Carbonate ER 350 mg p.o. at 12 noon starting 02/19/2023 --Continue Latuda tablet 80 mg daily before supper for bipolar depression --Continue trazodone tablet 50 mg p.o. as  needed for sleep --Continue hydroxyzine 25 mg p.o. 3 times daily as needed anxiety   Agitation protocol: Benadryl capsule 50 mg p.o. 3 times daily as needed agitation or Benadryl injection 50 mg IM 3 times daily as needed agitation   Haldol tablets 5 mg po 3 times daily as needed agitation or Haldol lactate injection 5 mg IM 3 times daily as needed agitation   Lorazepam tablet 2 mg p.o. 3 times daily as needed agitation or Lorazepam injection 2 mg IM 3 times daily as needed agitation   Other PRN Medications -Acetaminophen 650 mg every 6 as needed/mild pain -Maalox 30 mL oral every 4 as needed/digestion -Magnesium hydroxide 30 mL daily as needed/mild constipation   Safety and Monitoring: Voluntary admission to inpatient psychiatric unit for safety, stabilization and treatment Daily contact with patient to assess and evaluate symptoms and progress in treatment Patient's case to be discussed in multi-disciplinary team meeting Observation Level : q15 minute checks Vital signs: q12 hours Precautions: suicide, but pt currently verbally contracts for safety on unit    Discharge Planning: Social work and case management to assist with discharge planning and identification of hospital follow-up needs prior to discharge Estimated LOS: 5-7 days Discharge Concerns: Need to establish a safety plan; Medication compliance and effectiveness Discharge Goals: Return home with outpatient referrals for mental health follow-up including medication management/psychotherapy.   Long Term Goal(s): Improvement in symptoms so as ready for discharge   Short Term Goals: Ability to identify changes in lifestyle to reduce recurrence of condition will improve, Ability to verbalize feelings will improve, Ability to disclose and discuss suicidal ideas, Ability to demonstrate self-control will improve, Ability to identify and develop effective coping behaviors will improve, Ability to maintain clinical measurements  within normal limits will improve, Compliance with prescribed medications will improve, and Ability to identify triggers associated with substance abuse/mental health issues will improve   Physician Treatment Plan for Secondary Diagnosis: Principal Problem:   MDD (  major depressive disorder)   I certify that inpatient services furnished can reasonably be expected to improve the patient's condition.     Laretta Bolster, FNP 02/18/2023, 4:00 PM

## 2023-02-18 NOTE — Progress Notes (Signed)
   02/18/23 2200  Psych Admission Type (Psych Patients Only)  Admission Status Voluntary  Psychosocial Assessment  Patient Complaints Depression  Eye Contact Fair  Facial Expression Flat;Wide-eyed  Affect Depressed  Speech Logical/coherent  Interaction Assertive  Motor Activity Slow  Appearance/Hygiene Unremarkable  Behavior Characteristics Cooperative;Appropriate to situation  Mood Depressed;Pleasant  Thought Process  Coherency WDL  Content WDL  Delusions None reported or observed  Perception WDL  Hallucination None reported or observed  Judgment Limited  Confusion None  Danger to Self  Current suicidal ideation? Denies  Agreement Not to Harm Self Yes  Description of Agreement verbal  Danger to Others  Danger to Others None reported or observed  Danger to Others Abnormal  Harmful Behavior to others No threats or harm toward other people  Destructive Behavior No threats or harm toward property

## 2023-02-18 NOTE — Group Note (Signed)
LCSW Group Therapy Note   Group Date: 02/18/2023 Start Time: 1100 End Time: 1200  Type of Group and Topic: Psychoeducational Group: Discharge Planning  Participation Level: Active  Description of Group Discharge planning group reviews patient's anticipated discharge plans and assists patients to anticipate and address any barriers to wellness/recovery in the community. Suicide prevention education is reviewed with patients in group.  Therapeutic Goals 1. Patients will state their anticipated discharge plan and mental health aftercare 2. Patients will identify potential barriers to wellness in the community setting 3. Patients will engage in problem solving, solution focused discussion of ways to anticipate and address barriers to wellness/recovery  Summary of Patient Progress: The Pt attended group and remained there the entire time.  The Pt accepted all worksheets and materials provided.  The Pt was appropriate with all peers and staff.  The Pt demonstrated understanding of the topic being discussed by sharing their thoughts and feeling about discharge and sharing community resources with their peers.    Darleen Crocker, LCSWA 02/18/2023  1:34 PM

## 2023-02-18 NOTE — Group Note (Signed)
Date:  02/18/2023 Time:  10:43 PM  Group Topic/Focus:  Wrap-Up Group:   The focus of this group is to help patients review their daily goal of treatment and discuss progress on daily workbooks.    Participation Level:  Active  Participation Quality:  Appropriate and Attentive  Affect:  Appropriate  Cognitive:  Alert and Appropriate  Insight: Appropriate and Good  Engagement in Group:  Engaged  Modes of Intervention:  Discussion and Education  Additional Comments:  Pt attended and participated in wrap up group this evening. In this group we discussed the patient's day, their goals and progress while in the facility.   Keith Gilbert 02/18/2023, 10:43 PM

## 2023-02-18 NOTE — Progress Notes (Signed)
   02/18/23 0640  15 Minute Checks  Location Hallway  Visual Appearance Calm  Behavior Composed  Sleep (Behavioral Health Patients Only)  Calculate sleep? (Click Yes once per 24 hr at 0600 safety check) Yes  Documented sleep last 24 hours 4.25

## 2023-02-18 NOTE — Progress Notes (Signed)
Patient awake early this morning and took a shower. Patient denies SI,HI, and A/V/H with no plan or intent. Patient is cooperative on unit but with minimal interaction. Patient refused his lithium this morning due to it being "very sedating." Provider notified during progression meeting. No s/s of current distress.

## 2023-02-18 NOTE — Group Note (Signed)
Date:  02/18/2023 Time:  10:37 AM  Group Topic/Focus:  Orientation:   The focus of this group is to educate the patient on the purpose and policies of crisis stabilization and provide a format to answer questions about their admission.  The group details unit policies and expectations of patients while admitted.    Participation Level:  Active  Participation Quality:  Appropriate  Affect:  Appropriate  Cognitive:  Appropriate  Insight: Appropriate  Engagement in Group:  Engaged  Modes of Intervention:  Discussion  Additional Comments:     Jerrye Beavers 02/18/2023, 10:37 AM

## 2023-02-19 MED ORDER — LACTASE 3000 UNITS PO TABS
6000.0000 [IU] | ORAL_TABLET | ORAL | Status: DC | PRN
  Administered 2023-02-19 – 2023-02-22 (×3): 6000 [IU] via ORAL
  Filled 2023-02-19: qty 2

## 2023-02-19 NOTE — Progress Notes (Signed)
Chaplain met with Keith Gilbert to provide support after multiple suicide attempts.  He feels the need to "wander" to leave his life behind.  When probed, he was able to identify that it was because he didn't want his wife and son (his wife is pregnant with their first child) to suffer from his illness.  Chaplain provided reflective listening and assisted with reframing how his love for them could include staying and getting the support he needs.  He requested prayer and chaplain provided prayer as well as emotional and spiritual support.  [address] Stella Pager, [phone]

## 2023-02-19 NOTE — BHH Counselor (Signed)
CSW spoke with Judson Roch at [facility] [phone] who stated that the Pt is scheduled for a phone interview at 1:15pm on 02/19/2023.  CSW provided the Pt with this information.  CSW spoke with Judson Roch at [facility] at 1:50pm and was informed that the Pt did complete his scheduled phone interview and his paperwork has been sent to the admissions staff for review.  Judson Roch states that she will contact CSW with an update or scheduled date to begin services once she receives it from admission.  CSW will update Pt and provider as information is received.

## 2023-02-19 NOTE — Progress Notes (Signed)
D: Pt denied SI/HI/AVH this morning. Pt complains of excessive urination this morning r/t medications. RN provided pt with a pitcher of water and encouraged pt to remain hydrated throughout the day. Pt has been pleasant, calm, and cooperative throughout the shift.   A: RN provided support and encouragement to patient. Pt given scheduled medications as prescribed. Q15 min checks verified for safety.    R: Patient verbally contracts for safety. Patient compliant with medications and treatment plan. Patient is interacting well on the unit. Pt is safe on the unit.   02/19/23 1300  Psych Admission Type (Psych Patients Only)  Admission Status Voluntary  Psychosocial Assessment  Patient Complaints Anxiety;Depression  Eye Contact Fair  Facial Expression Flat;Wide-eyed  Affect Anxious;Depressed  Speech Logical/coherent  Interaction Assertive  Motor Activity Slow  Appearance/Hygiene Unremarkable  Behavior Characteristics Cooperative;Appropriate to situation  Mood Depressed;Pleasant  Thought Process  Coherency WDL  Content WDL  Delusions None reported or observed  Perception WDL  Hallucination None reported or observed  Judgment Impaired  Confusion None  Danger to Self  Current suicidal ideation? Denies  Agreement Not to Harm Self Yes  Description of Agreement Verbal  Danger to Others  Danger to Others None reported or observed  Danger to Others Abnormal  Harmful Behavior to others No threats or harm toward other people  Destructive Behavior No threats or harm toward property

## 2023-02-19 NOTE — Progress Notes (Signed)
   02/19/23 0555  15 Minute Checks  Location Bedroom  Visual Appearance Calm  Behavior Sleeping  Sleep (Behavioral Health Patients Only)  Calculate sleep? (Click Yes once per 24 hr at 0600 safety check) Yes  Documented sleep last 24 hours 8

## 2023-02-19 NOTE — BHH Group Notes (Signed)
[facility] Group Notes:  (Nursing/MHT/Case Management/Adjunct)  Date:  02/19/2023  Time:  8:26 PM  Type of Therapy:   AA  Participation Level:  Did Not Attend  Participation Quality:   na  Affect:   na  Cognitive:   na  Insight:  None  Engagement in Group:   na  Modes of Intervention:   na  Summary of Progress/Problems: didn't attend group.  Orvan Falconer 02/19/2023, 8:26 PM

## 2023-02-19 NOTE — Group Note (Signed)
Recreation Therapy Group Note   Group Topic:Problem Solving  Group Date: 02/19/2023 Start Time: 0935 End Time: 1005 Facilitators: Jasiah Buntin-McCall, LRT,CTRS Location: [address] Dayroom   Goal Area(s) Addresses:  Patient will verbalize importance of using appropriate problem solving techniques.  Patient will identify positive change associated with effective problem solving skills.   Group Description:  Trivia.  In celebration of [place]. Patrick's Day, LRT engaged with group with St. Patrick's Day trivia.  LRT would read the questions and patients were given 10 seconds to write their responses down.  LRT would then give the group the correct answer.    Affect/Mood: N/A   Participation Level: Did not attend    Clinical Observations/Individualized Feedback:     Plan: Continue to engage patient in RT group sessions 2-3x/week.   Christropher Gintz-McCall, LRT,CTRS  02/19/2023 1:21 PM

## 2023-02-19 NOTE — Plan of Care (Signed)
°  Problem: Education: °Goal: Emotional status will improve °Outcome: Progressing °Goal: Mental status will improve °Outcome: Progressing °  °Problem: Activity: °Goal: Interest or engagement in activities will improve °Outcome: Progressing °  °

## 2023-02-19 NOTE — Progress Notes (Signed)
   02/19/23 2150  Psych Admission Type (Psych Patients Only)  Admission Status Voluntary  Psychosocial Assessment  Patient Complaints Depression;Worrying  Eye Contact Brief  Facial Expression Flat  Affect Appropriate to circumstance;Blunted;Depressed;Preoccupied;Flat  Speech Logical/coherent;Slow  Interaction Minimal;No initiation  Motor Activity Slow  Appearance/Hygiene Unremarkable  Behavior Characteristics Cooperative;Appropriate to situation  Mood Depressed;Preoccupied;Pleasant  Thought Process  Coherency WDL  Content Blaming self;Preoccupation  Delusions None reported or observed  Perception WDL  Hallucination None reported or observed  Judgment Impaired  Confusion None  Danger to Self  Current suicidal ideation? Passive  Agreement Not to Harm Self Yes  Description of Agreement verbal  Danger to Others  Danger to Others None reported or observed

## 2023-02-20 NOTE — Group Note (Signed)
Date:  02/20/2023 Time:  11:50 AM  Group Topic/Focus:  Goals Group:   The focus of this group is to help patients establish daily goals to achieve during treatment and discuss how the patient can incorporate goal setting into their daily lives to aide in recovery. Orientation:   The focus of this group is to educate the patient on the purpose and policies of crisis stabilization and provide a format to answer questions about their admission.  The group details unit policies and expectations of patients while admitted.    Participation Level:  Did Not Attend  Participation Quality:    Affect:    Cognitive:    Insight:   Engagement in Group:    Modes of Intervention:    Additional Comments:    Garvin Fila 02/20/2023, 11:50 AM

## 2023-02-20 NOTE — Progress Notes (Signed)
Psychoeducational Group Note  Date:  02/20/2023 Time:  2000  Group Topic/Focus:  Wrap up group  Participation Level: Did Not Attend  Participation Quality:  Not Applicable  Affect:  Not Applicable  Cognitive:  Not Applicable  Insight:  Not Applicable  Engagement in Group: Not Applicable  Additional Comments:  Did not attend.   Shellia Cleverly 02/20/2023, 8:59 PM

## 2023-02-20 NOTE — Plan of Care (Signed)
  Problem: Safety: Goal: Periods of time without injury will increase Outcome: Progressing   

## 2023-02-20 NOTE — Progress Notes (Signed)
   02/20/23 2100  Psych Admission Type (Psych Patients Only)  Admission Status Voluntary  Psychosocial Assessment  Patient Complaints Depression  Eye Contact Brief  Facial Expression Flat  Affect Anxious  Speech Logical/coherent  Interaction Minimal  Motor Activity Slow  Appearance/Hygiene Unremarkable  Behavior Characteristics Cooperative;Appropriate to situation  Mood Depressed  Thought Process  Coherency WDL  Content WDL  Delusions None reported or observed  Perception WDL  Hallucination None reported or observed  Judgment Impaired  Confusion None  Danger to Self  Current suicidal ideation? Denies  Agreement Not to Harm Self Yes  Description of Agreement Verbal  Danger to Others  Danger to Others None reported or observed  Danger to Others Abnormal  Harmful Behavior to others No threats or harm toward other people   Alert/oriented. Makes needs/concerns known to staff. Pleasant cooperative with staff. Denies SI/HI/A/V hallucinations. Med compliant. Patient states went to group. Will encourage continue compliance and progression towards goals. Verbally contracted for safety. Will continue to monitor.

## 2023-02-20 NOTE — Progress Notes (Signed)

## 2023-02-20 NOTE — Group Note (Signed)
Date:  02/20/2023 Time:  4:25 PM  Group Topic/Focus:  Self Care:   The focus of this group is to help patients understand the importance of self-care in order to improve or restore emotional, physical, spiritual, interpersonal, and financial health.    Participation Level:  Did Not Attend  Participation Quality:    Affect:    Cognitive:    Insight:   Engagement in Group:    Modes of Intervention:    Additional Comments:    Garvin Fila 02/20/2023, 4:25 PM

## 2023-02-20 NOTE — Progress Notes (Signed)
   02/20/23 0558  15 Minute Checks  Location Bedroom  Visual Appearance Calm  Behavior Sleeping  Sleep (Behavioral Health Patients Only)  Calculate sleep? (Click Yes once per 24 hr at 0600 safety check) Yes  Documented sleep last 24 hours 8

## 2023-02-20 NOTE — Progress Notes (Signed)
[facility] MD Progress Note  02/20/2023 2:02 PM Keith Gilbert  MRN:  [id number]  Reason for admission:Legend Washko is a 47 year old African-American male with prior psychiatric history significant for major depressive disorder recurrent severe, and bipolar disorder, who presents voluntarily to [facility] from [facility] ED for worsening depressive symptoms resulting in suicidal ideation in the context of losing his job on Monday, 02/15/2023.  Patient was examined and stabilize at [facility], ED prior to admission at [facility] for further evaluation of his mental health disorder and treatment.     24-hour chart review: Patient compliant with scheduled medications.  As needed medication of last days tablets 6000 units given for given for lactose intolerance at yesterday at 1711.  Patient visible on the unit and attending groups activities and therapeutic milieu. Vital signs reviewed with no critical values.  Today's assessment: Keith Gilbert is seen and examined in the unit without acute discomfort.  He is visible on the unit, however did not attend groups activities this morning.  Chart reviewed, findings shared with the treatment team and consult with the attending psychiatrist.  Reports mood is less depressed compared to during admission.  Affect appears constricted.  Rates depression as 7/10 with 10 being the worst and anxiety at 8/10, with 10 being the worst.  Made patient aware to ask the nursing staff for as needed medication for anxiety.  He continues on lithium 450 mg at nighttime and 300 mg at noon for mood stabilization, and Latuda 80 mg p.o. daily for bipolar depressive symptoms.  Reports appetite is good.  Patient sleeping over 8 hours last night as needed of trazodone.  Patient reports having good visit with the wife last night, and they plan for a 30-day inpatient rehab after discharge from [facility].  Patient continues to endorse minimal passive SI without plans or intent while at the  hospital.  No objective response to internal stimuli during assessment.  Patient denies HI or AVH.  Principal Problem: MDD (major depressive disorder)  Diagnosis: Principal Problem:   MDD (major depressive disorder)  Total Time spent with patient: 30 minutes  Past Psychiatric History:   Past Medical History:  Past Medical History:  Diagnosis Date   Anxiety    Bipolar 1 disorder ([place])    Depression     Past Surgical History:  Procedure Laterality Date   None     Family History:  Family History  Problem Relation Age of Onset   Heart disease Mother    Heart disease Father    Family Psychiatric  History: Psych: Maternal side depression Psych Rx: Yes SA/HA: Denies Substance use family hx: Patient denies  Social History:  Social History   Substance and Sexual Activity  Alcohol Use Yes     Social History   Substance and Sexual Activity  Drug Use Yes   Frequency: 4.0 times per week   Types: Marijuana    Social History   Socioeconomic History   Marital status: Married    Spouse name: Not on file   Number of children: Not on file   Years of education: Not on file   Highest education level: Not on file  Occupational History   Not on file  Tobacco Use   Smoking status: Never   Smokeless tobacco: Never  Substance and Sexual Activity   Alcohol use: Yes   Drug use: Yes    Frequency: 4.0 times per week    Types: Marijuana   Sexual activity: Yes  Other  Topics Concern   Not on file  Social History Narrative   Not on file   Social Determinants of Health   Financial Resource Strain: Not on file  Food Insecurity: Patient Declined (02/16/2023)   Hunger Vital Sign    Worried About Running Out of Food in the Last Year: Patient declined    Clay in the Last Year: Patient declined  Transportation Needs: No Transportation Needs (02/16/2023)   PRAPARE - Hydrologist (Medical): No    Lack of Transportation (Non-Medical): No   Physical Activity: Not on file  Stress: Not on file  Social Connections: Not on file   Additional Social History:    Sleep: Good  Appetite:  Good  Current Medications: Current Facility-Administered Medications  Medication Dose Route Frequency Provider Last Rate Last Admin   acetaminophen (TYLENOL) tablet 650 mg  650 mg Oral Q6H PRN Vesta Mixer, NP       alum & mag hydroxide-simeth (MAALOX/MYLANTA) 200-200-20 MG/5ML suspension 30 mL  30 mL Oral Q4H PRN Vesta Mixer, NP       diphenhydrAMINE (BENADRYL) capsule 50 mg  50 mg Oral TID PRN Vesta Mixer, NP       Or   diphenhydrAMINE (BENADRYL) injection 50 mg  50 mg Intramuscular TID PRN Vesta Mixer, NP       haloperidol (HALDOL) tablet 5 mg  5 mg Oral TID PRN Vesta Mixer, NP       Or   haloperidol lactate (HALDOL) injection 5 mg  5 mg Intramuscular TID PRN Vesta Mixer, NP       hydrOXYzine (ATARAX) tablet 25 mg  25 mg Oral TID PRN Vesta Mixer, NP       lactase (LACTAID) tablet 6,000 Units  6,000 Units Oral PRN Ranae Palms, MD   6,000 Units at 02/19/23 1711   lithium carbonate (ESKALITH) ER tablet 450 mg  450 mg Oral QHS Chlora Mcbain, Kris Hartmann, FNP   450 mg at 02/19/23 2149   lithium carbonate (LITHOBID) ER tablet 300 mg  300 mg Oral Q lunch Anahli Arvanitis C, FNP   300 mg at 02/20/23 1153   LORazepam (ATIVAN) tablet 2 mg  2 mg Oral TID PRN Vesta Mixer, NP       Or   LORazepam (ATIVAN) injection 2 mg  2 mg Intramuscular TID PRN Vesta Mixer, NP       lurasidone (LATUDA) tablet 80 mg  80 mg Oral QAC supper Vesta Mixer, NP   80 mg at 02/19/23 1710   magnesium hydroxide (MILK OF MAGNESIA) suspension 30 mL  30 mL Oral Daily PRN Vesta Mixer, NP   30 mL at 02/17/23 1001   traZODone (DESYREL) tablet 50 mg  50 mg Oral QHS PRN Vesta Mixer, NP        Lab Results:  No results found for this or any previous visit (from the past 48 hour(s)).   Blood Alcohol level:  Lab Results  Component Value  Date   ETH <10 0000000    Metabolic Disorder Labs: No results found for: "HGBA1C", "MPG" No results found for: "PROLACTIN" No results found for: "CHOL", "TRIG", "HDL", "CHOLHDL", "VLDL", "LDLCALC"  Physical Findings: AIMS: Facial and Oral Movements Muscles of Facial Expression: None, normal Lips and Perioral Area: None, normal Jaw: None, normal Tongue: None, normal,Extremity Movements Upper (arms, wrists, hands, fingers): None, normal Lower (legs, knees, ankles, toes): None, normal, Trunk Movements Neck, shoulders, hips: None, normal, Overall Severity Severity of abnormal  movements (highest score from questions above): None, normal Incapacitation due to abnormal movements: None, normal Patient's awareness of abnormal movements (rate only patient's report): No Awareness, Dental Status Current problems with teeth and/or dentures?: No Does patient usually wear dentures?: No  CIWA:    COWS:     Musculoskeletal: Strength & Muscle Tone: within normal limits Gait & Station: normal Patient leans: N/A  Psychiatric Specialty Exam:  Presentation  General Appearance:  Appropriate for Environment; Casual; Fairly Groomed  Eye Contact: Good  Speech: Clear and Coherent; Normal Rate  Speech Volume: Normal  Handedness: Right  Mood and Affect  Mood: Anxious; Depressed  Affect: Congruent  Thought Process  Thought Processes: Coherent; Goal Directed  Descriptions of Associations:Intact  Orientation:Full (Time, Place and Person)  Thought Content:Logical  History of Schizophrenia/Schizoaffective disorder:No  Duration of Psychotic Symptoms:No data recorded Hallucinations:Hallucinations: None  Ideas of Reference:None  Suicidal Thoughts:Suicidal Thoughts: Yes, Passive SI Passive Intent and/or Plan: Without Intent; Without Plan; Without Means to Carry Out  Homicidal Thoughts:Homicidal Thoughts: No  Sensorium  Memory: Immediate Good; Recent  Fair  Judgment: Fair  Insight: Fair  Executive Functions  Concentration: Good  Attention Span: Good  Recall: Good  Fund of Knowledge: Fair  Language: Good  Psychomotor Activity  Psychomotor Activity: Psychomotor Activity: Normal  Assets  Assets: Communication Skills; Desire for Improvement; Physical Health; Social Support  Sleep  Sleep: Sleep: Good Number of Hours of Sleep: 8  Physical Exam: Physical Exam Vitals and nursing note reviewed.  HENT:     Head: Normocephalic.     Nose: Nose normal.     Mouth/Throat:     Mouth: Mucous membranes are moist.     Pharynx: Oropharynx is clear.  Eyes:     Conjunctiva/sclera: Conjunctivae normal.     Pupils: Pupils are equal, round, and reactive to light.  Cardiovascular:     Rate and Rhythm: Normal rate.     Pulses: Normal pulses.  Pulmonary:     Effort: Pulmonary effort is normal.  Abdominal:     Palpations: Abdomen is soft.  Genitourinary:    Comments: Deferred Musculoskeletal:        General: Normal range of motion.     Cervical back: Normal range of motion.  Skin:    General: Skin is warm.  Neurological:     General: No focal deficit present.     Mental Status: He is alert and oriented to person, place, and time.  Psychiatric:        Behavior: Behavior normal.    Review of Systems  Constitutional: Negative.   HENT: Negative.    Eyes: Negative.   Respiratory: Negative.    Cardiovascular: Negative.   Gastrointestinal: Negative.   Genitourinary: Negative.   Musculoskeletal: Negative.   Skin: Negative.   Neurological: Negative.   Endo/Heme/Allergies: Negative.   Psychiatric/Behavioral:  Positive for depression and suicidal ideas. The patient is nervous/anxious.    Blood pressure (!) 124/93, pulse 89, temperature 98.4 F (36.9 C), temperature source Oral, resp. rate 18, height 5\' 10"  (1.778 m), weight 98.2 kg, SpO2 94 %. Body mass index is 31.06 kg/m.   Treatment Plan Summary: Daily contact  with patient to assess and evaluate symptoms and progress in treatment and Medication management  Observation Level/Precautions:  15 minute checks  Laboratory:  CBC Chemistry Profile HbAIC UDS UA  Psychotherapy: Therapeutic milieu  Medications: See MAR  Consultations: Social work  Discharge Concerns: Safety  Estimated LOS: 5 to 7 days  Other:  N/A  Labs:             CMP: Glucose 114 high, creatinine 1.40 high, otherwise normal             CBC: Within normal limits Lithium level: 0.23 low, increase lithium to 450 mg p.o. twice daily will recheck level 02/22/23 UDS: Negative   EKG: NSR; ventricular rate 79; QT /QTc 368/421   Physician Treatment Plan for Primary Diagnosis:  Assessment MDD (major depressive disorder)   Plan: Medication: --Increase lithium carbonate ER tablets to 600 mg p.o. nightly starting 02/18/2023 --Initiates lithium Carbonate ER 350 mg p.o. at 12 noon starting 02/19/2023 --Continue Latuda tablet 80 mg daily before supper for bipolar depression --Continue trazodone tablet 50 mg p.o. as needed for sleep --Continue hydroxyzine 25 mg p.o. 3 times daily as needed anxiety   Agitation protocol: Benadryl capsule 50 mg p.o. 3 times daily as needed agitation or Benadryl injection 50 mg IM 3 times daily as needed agitation   Haldol tablets 5 mg po 3 times daily as needed agitation or Haldol lactate injection 5 mg IM 3 times daily as needed agitation   Lorazepam tablet 2 mg p.o. 3 times daily as needed agitation or Lorazepam injection 2 mg IM 3 times daily as needed agitation   Other PRN Medications -Acetaminophen 650 mg every 6 as needed/mild pain -Maalox 30 mL oral every 4 as needed/digestion -Magnesium hydroxide 30 mL daily as needed/mild constipation   Safety and Monitoring: Voluntary admission to inpatient psychiatric unit for safety, stabilization and treatment Daily contact with patient to assess and evaluate symptoms and progress in treatment Patient's  case to be discussed in multi-disciplinary team meeting Observation Level : q15 minute checks Vital signs: q12 hours Precautions: suicide, but pt currently verbally contracts for safety on unit    Discharge Planning: Social work and case management to assist with discharge planning and identification of hospital follow-up needs prior to discharge Estimated LOS: 5-7 days Discharge Concerns: Need to establish a safety plan; Medication compliance and effectiveness Discharge Goals: Return home with outpatient referrals for mental health follow-up including medication management/psychotherapy.   Long Term Goal(s): Improvement in symptoms so as ready for discharge   Short Term Goals: Ability to identify changes in lifestyle to reduce recurrence of condition will improve, Ability to verbalize feelings will improve, Ability to disclose and discuss suicidal ideas, Ability to demonstrate self-control will improve, Ability to identify and develop effective coping behaviors will improve, Ability to maintain clinical measurements within normal limits will improve, Compliance with prescribed medications will improve, and Ability to identify triggers associated with substance abuse/mental health issues will improve   Physician Treatment Plan for Secondary Diagnosis: Principal Problem:   MDD (major depressive disorder)   I certify that inpatient services furnished can reasonably be expected to improve the patient's condition.     Laretta Bolster, FNP 02/20/2023, 2:02 PMPatient ID: [name], male   DOB: 1976-07-08, 47 y.o.   MRN: [id number]

## 2023-02-21 DIAGNOSIS — F314 Bipolar disorder, current episode depressed, severe, without psychotic features: Principal | ICD-10-CM | POA: Diagnosis present

## 2023-02-21 DIAGNOSIS — F411 Generalized anxiety disorder: Secondary | ICD-10-CM | POA: Diagnosis present

## 2023-02-21 NOTE — Plan of Care (Signed)
  Problem: Safety: Goal: Periods of time without injury will increase Outcome: Progressing   

## 2023-02-21 NOTE — Progress Notes (Signed)
   02/21/23 1300  Psych Admission Type (Psych Patients Only)  Admission Status Voluntary  Psychosocial Assessment  Patient Complaints Anxiety;Depression  Eye Contact Fair  Facial Expression Flat  Affect Anxious  Speech Slow  Interaction Minimal  Motor Activity Slow  Appearance/Hygiene Unremarkable  Behavior Characteristics Appropriate to situation;Cooperative  Mood Depressed;Anxious  Thought Process  Coherency WDL  Content WDL  Delusions None reported or observed  Perception WDL  Hallucination None reported or observed  Judgment Impaired  Confusion None  Danger to Self  Current suicidal ideation? Denies  Danger to Others  Danger to Others None reported or observed  Danger to Others Abnormal  Harmful Behavior to others No threats or harm toward other people

## 2023-02-21 NOTE — Progress Notes (Signed)
Pt initially denied having SI when this RN met with pt.  Pt then said, "I have these thoughts that are so hard to go away...but I feel like I want to walk away from everything."  Pt then described that he knows his family needs him and pt needs his family but that he was having anxiety about going to rehab center, post discharge from [facility].  RN actively listened, provided support and validated pt's feelings and concerns. RN encouraged pt to go to skills building group that is going on right now, and pt declined.

## 2023-02-21 NOTE — Progress Notes (Signed)
   02/21/23 2209  Psych Admission Type (Psych Patients Only)  Admission Status Voluntary  Psychosocial Assessment  Patient Complaints Anxiety;Depression  Eye Contact Brief  Facial Expression Flat  Affect Anxious  Speech Logical/coherent  Interaction Minimal  Motor Activity Slow  Appearance/Hygiene Unremarkable  Behavior Characteristics Appropriate to situation  Mood Depressed;Anxious  Thought Process  Coherency WDL  Content WDL  Delusions None reported or observed  Perception WDL  Hallucination None reported or observed  Judgment Impaired  Confusion None  Danger to Self  Current suicidal ideation? Denies  Agreement Not to Harm Self Yes  Description of Agreement Verbal  Danger to Others  Danger to Others None reported or observed  Danger to Others Abnormal  Harmful Behavior to others No threats or harm toward other people   Alert/oriented. Makes needs/concerns known to staff. Pleasant cooperative with staff. Denies SI/HI/A/V hallucinations. Med compliant. PRN med given with good effect. Will encourage continued compliance and progression towards goals. Verbally contracted for safety. Will continue to monitor.

## 2023-02-21 NOTE — BHH Group Notes (Signed)
Pt did not attend goals group. 

## 2023-02-21 NOTE — BHH Group Notes (Signed)
[facility] Group Notes:  (Nursing)  Date:  02/21/2023  Time:  1300  Type of Therapy:  Psychoeducational Skills  Participation Level:  Did Not Attend     Waymond Cera 02/21/2023, 4:53 PM

## 2023-02-21 NOTE — Progress Notes (Addendum)
[facility] MD Progress Note  02/21/2023 4:10 PM Keith Gilbert  MRN:  [id number]  Reason for admission:[name] is a 47 year old African-American male with prior psychiatric history significant for major depressive disorder recurrent severe, and bipolar disorder, who presented voluntarily to [facility] from [facility] ED for worsening depressive symptoms resulting in suicidal ideation in the context of losing his job on Monday, 02/15/2023.  Patient was examined and stabilize at [facility], ED prior to admission at [facility] for further evaluation of his mental health disorder and treatment.     24-hour chart review: DBP elevated earlier this morning in the 90s, heart rate 115, nursing staff notified of the need for repeat vitals. Pt is compliant with medications, no PRN medications given overnight. No behavioral episodes noted in the past 24 hrs.   Today's assessment 02/21/23: Pt presents with a flat affect and depressed mood, attention to personal hygiene and grooming is fair, eye contact is good, speech is clear & coherent. Thought contents are organized and logical, and pt currently denies SI/HI/AVH or paranoia. There is no evidence of delusional thoughts.    Pt reports today that "I wish I could just walk away". When asked to elaborate further on this, patient states that he wishes that he could just walk away from his family and not return.  He reports current stressors as a recent job loss, a baby on the way, and the poor self esteem that he has. He however denies feeling suicidal at this time.  He rates his anxiety as a 7, 10 being worst, rates depression as a 6, 10 being worst.  He states that this is a slight improvement because prior to admission both anxiety and depression where 10, with 10 being worst.  Patient states that he is ready to be discharged to go to an inpatient treatment facility in [place] for 30 days, states that the program was sought by his wife, but  continues to state that his symptoms have only mildly improved since hospitalization.  Patient reports a lot of trauma in his past, states that this has played a role in his poor self esteem, but is not open about discussing it, states that he has tried therapy in the past, but it was not helpful.  Patient educated on trauma focused therapy, and notified that CSW will be made aware that he is in need of a therapist prior to discharge.  He states that he has taking Latuda for the past 3 months, and has also been on lithium for the past 3 months, but states that he is just on higher doses of this medication during this hospitalization.  He states that this medications are not helpful.  Patient educated that sometimes medications need to be increased in dosages over time, and he has to be consistent with taking the medications to have full therapeutic effects from them. He verbalized understanding.  As per attending psychiatrist and other staff reports, patient's wife is very involved with his medication dosages, and directs his care, and has not been open to allowing for an increase in the dose of his lithium more than it is currently.  This might explain why he is still not at a, therapeutic range yet. Lithium level to be ordered for a redraw tomorrow morning (3/18). We are therefore continuing lithium at current dose, along with other medications as listed below.  We will revisit medication dosages tomorrow.  For now patient continues to meet inpatient hospitalization, as he  is continuing to be very depressed as per above subjective and objective assessments.   Principal Problem: Severe bipolar I disorder, current or most recent episode depressed ([place])  Diagnosis: Principal Problem:   Severe bipolar I disorder, current or most recent episode depressed ([place]) Active Problems:   Generalized anxiety disorder  Total Time spent with patient: 30 minutes  Past Psychiatric History:   Past Medical History:   Past Medical History:  Diagnosis Date   Anxiety    Bipolar 1 disorder ([place])    Depression     Past Surgical History:  Procedure Laterality Date   None     Family History:  Family History  Problem Relation Age of Onset   Heart disease Mother    Heart disease Father    Family Psychiatric  History: Psych: Maternal side depression Psych Rx: Yes SA/HA: Denies Substance use family hx: Patient denies  Social History:  Social History   Substance and Sexual Activity  Alcohol Use Yes     Social History   Substance and Sexual Activity  Drug Use Yes   Frequency: 4.0 times per week   Types: Marijuana    Social History   Socioeconomic History   Marital status: Married    Spouse name: Not on file   Number of children: Not on file   Years of education: Not on file   Highest education level: Not on file  Occupational History   Not on file  Tobacco Use   Smoking status: Never   Smokeless tobacco: Never  Substance and Sexual Activity   Alcohol use: Yes   Drug use: Yes    Frequency: 4.0 times per week    Types: Marijuana   Sexual activity: Yes  Other Topics Concern   Not on file  Social History Narrative   Not on file   Social Determinants of Health   Financial Resource Strain: Not on file  Food Insecurity: Patient Declined (02/16/2023)   Hunger Vital Sign    Worried About Running Out of Food in the Last Year: Patient declined    [place] in the Last Year: Patient declined  Transportation Needs: No Transportation Needs (02/16/2023)   PRAPARE - Hydrologist (Medical): No    Lack of Transportation (Non-Medical): No  Physical Activity: Not on file  Stress: Not on file  Social Connections: Not on file   Additional Social History:    Sleep: Good  Appetite:  Good  Current Medications: Current Facility-Administered Medications  Medication Dose Route Frequency Provider Last Rate Last Admin   acetaminophen (TYLENOL) tablet 650 mg   650 mg Oral Q6H PRN Vesta Mixer, NP       alum & mag hydroxide-simeth (MAALOX/MYLANTA) 200-200-20 MG/5ML suspension 30 mL  30 mL Oral Q4H PRN Vesta Mixer, NP       diphenhydrAMINE (BENADRYL) capsule 50 mg  50 mg Oral TID PRN Vesta Mixer, NP       Or   diphenhydrAMINE (BENADRYL) injection 50 mg  50 mg Intramuscular TID PRN Vesta Mixer, NP       haloperidol (HALDOL) tablet 5 mg  5 mg Oral TID PRN Vesta Mixer, NP       Or   haloperidol lactate (HALDOL) injection 5 mg  5 mg Intramuscular TID PRN Vesta Mixer, NP       hydrOXYzine (ATARAX) tablet 25 mg  25 mg Oral TID PRN Vesta Mixer, NP       lactase (LACTAID) tablet  6,000 Units  6,000 Units Oral PRN Ranae Palms, MD   6,000 Units at 02/19/23 1711   lithium carbonate (ESKALITH) ER tablet 450 mg  450 mg Oral QHS NtuenKris Hartmann, FNP   450 mg at 02/20/23 2109   lithium carbonate (LITHOBID) ER tablet 300 mg  300 mg Oral Q lunch Ntuen, Kris Hartmann, FNP   300 mg at 02/21/23 1300   LORazepam (ATIVAN) tablet 2 mg  2 mg Oral TID PRN Vesta Mixer, NP       Or   LORazepam (ATIVAN) injection 2 mg  2 mg Intramuscular TID PRN Vesta Mixer, NP       lurasidone (LATUDA) tablet 80 mg  80 mg Oral QAC supper Vesta Mixer, NP   80 mg at 02/20/23 1727   magnesium hydroxide (MILK OF MAGNESIA) suspension 30 mL  30 mL Oral Daily PRN Vesta Mixer, NP   30 mL at 02/17/23 1001   traZODone (DESYREL) tablet 50 mg  50 mg Oral QHS PRN Vesta Mixer, NP        Lab Results:  No results found for this or any previous visit (from the past 48 hour(s)).   Blood Alcohol level:  Lab Results  Component Value Date   ETH <10 0000000    Metabolic Disorder Labs: No results found for: "HGBA1C", "MPG" No results found for: "PROLACTIN" No results found for: "CHOL", "TRIG", "HDL", "CHOLHDL", "VLDL", "LDLCALC"  Physical Findings: AIMS: Facial and Oral Movements Muscles of Facial Expression: None, normal Lips and Perioral Area:  None, normal Jaw: None, normal Tongue: None, normal,Extremity Movements Upper (arms, wrists, hands, fingers): None, normal Lower (legs, knees, ankles, toes): None, normal, Trunk Movements Neck, shoulders, hips: None, normal, Overall Severity Severity of abnormal movements (highest score from questions above): None, normal Incapacitation due to abnormal movements: None, normal Patient's awareness of abnormal movements (rate only patient's report): No Awareness, Dental Status Current problems with teeth and/or dentures?: No Does patient usually wear dentures?: No  CIWA:    COWS:     Musculoskeletal: Strength & Muscle Tone: within normal limits Gait & Station: normal Patient leans: N/A  Psychiatric Specialty Exam:  Presentation  General Appearance:  Appropriate for Environment; Fairly Groomed  Eye Contact: Fair  Speech: Clear and Coherent  Speech Volume: Normal  Handedness: Right  Mood and Affect  Mood: Anxious; Depressed  Affect: Flat; Depressed  Thought Process  Thought Processes: Coherent  Descriptions of Associations:Intact  Orientation:Full (Time, Place and Person)  Thought Content:Logical  History of Schizophrenia/Schizoaffective disorder:No  Duration of Psychotic Symptoms:No data recorded Hallucinations:Hallucinations: None  Ideas of Reference:None  Suicidal Thoughts:Suicidal Thoughts: No SI Passive Intent and/or Plan: Without Intent; Without Plan; Without Means to Carry Out  Homicidal Thoughts:Homicidal Thoughts: No  Sensorium  Memory: Immediate Good  Judgment: Fair  Insight: Fair  Community education officer  Concentration: Fair  Attention Span: Fair  Recall: [employer] of Knowledge: Fair  Language: Fair  Psychomotor Activity  Psychomotor Activity: Psychomotor Activity: Normal  Assets  Assets: Social Support; Housing  Sleep  Sleep: Sleep: Good Number of Hours of Sleep: 8  Physical Exam: Physical Exam Vitals and  nursing note reviewed.  HENT:     Head: Normocephalic.     Nose: Nose normal.     Mouth/Throat:     Mouth: Mucous membranes are moist.     Pharynx: Oropharynx is clear.  Eyes:     Conjunctiva/sclera: Conjunctivae normal.     Pupils: Pupils are equal, round, and reactive to light.  Cardiovascular:     Rate and Rhythm: Normal rate.     Pulses: Normal pulses.  Pulmonary:     Effort: Pulmonary effort is normal.  Abdominal:     Palpations: Abdomen is soft.  Genitourinary:    Comments: Deferred Musculoskeletal:        General: Normal range of motion.     Cervical back: Normal range of motion.  Skin:    General: Skin is warm.  Neurological:     General: No focal deficit present.     Mental Status: He is alert and oriented to person, place, and time.  Psychiatric:        Behavior: Behavior normal.    Review of Systems  Constitutional: Negative.   HENT: Negative.    Eyes: Negative.   Respiratory: Negative.    Cardiovascular: Negative.   Gastrointestinal: Negative.   Genitourinary: Negative.   Musculoskeletal: Negative.   Skin: Negative.   Neurological: Negative.   Endo/Heme/Allergies: Negative.   Psychiatric/Behavioral:  Positive for depression. Negative for hallucinations, memory loss, substance abuse and suicidal ideas. The patient is nervous/anxious and has insomnia.    Blood pressure (!) 129/93, pulse (!) 115, temperature 98.8 F (37.1 C), temperature source Oral, resp. rate 16, height 5\' 10"  (1.778 m), weight 98.2 kg, SpO2 96 %. Body mass index is 31.06 kg/m.  Treatment Plan Summary: Daily contact with patient to assess and evaluate symptoms and progress in treatment and Medication management  Observation Level/Precautions:  15 minute checks  Laboratory:  CBC Chemistry Profile HbAIC UDS UA  Psychotherapy: Therapeutic milieu  Medications: See MAR  Consultations: Social work  Discharge Concerns: Safety  Estimated LOS: 5 to 7 days  Other:  N/A    Labs:              CMP: Glucose 114 high, creatinine 1.40 high, otherwise normal             CBC: Within normal limits Lithium level: 0.23 low, increase lithium to 450 mg p.o. twice daily will recheck level 02/22/23 UDS: Negative   EKG: NSR; ventricular rate 79; QT /QTc 368/421   Diagnoses -Bipolar 1 d/o current episode depressed -GAD  Medications: --Continue lithium 450 mg nightly for mood stabilization --Continue lithium 350 mg in the mornings for mood stabilization --Continue Latuda tablet 80 mg daily before supper for bipolar depression --Continue trazodone tablet 50 mg p.o. as needed for sleep --Continue hydroxyzine 25 mg p.o. 3 times daily as needed anxiety   Agitation protocol: Benadryl capsule 50 mg p.o. 3 times daily as needed agitation or Benadryl injection 50 mg IM 3 times daily as needed agitation   Haldol tablets 5 mg po 3 times daily as needed agitation or Haldol lactate injection 5 mg IM 3 times daily as needed agitation   Lorazepam tablet 2 mg p.o. 3 times daily as needed agitation or Lorazepam injection 2 mg IM 3 times daily as needed agitation   Other PRN Medications -Acetaminophen 650 mg every 6 as needed/mild pain -Maalox 30 mL oral every 4 as needed/digestion -Magnesium hydroxide 30 mL daily as needed/mild constipation  ANTIPSYCHOTIC CONSENT : We discussed the risks, benefits, side effects, and alternatives to THE PRESCRIBED ANTIPSYCHOTIC, including but not limited to, the risk of fatigue, sedation, metabolic syndrome, weight gain, movement abnormalities such as tremor & cogwheeling & tardive dyskinesia, temperature sensitivity, photosensitivity, blood pressure changes, heart rhythm effects, potential for medication interactions, and to not take these medications with alcohol or illicit drugs; informed consent was obtained.  We discussed the necessity for routine monitoring including rating scales of abnormal movements, blood work, and ekgs, while the patient is prescribed  antipsychotic medication.   Safety and Monitoring: Voluntary admission to inpatient psychiatric unit for safety, stabilization and treatment Daily contact with patient to assess and evaluate symptoms and progress in treatment Patient's case to be discussed in multi-disciplinary team meeting Observation Level : q15 minute checks Vital signs: q12 hours Precautions: suicide, but pt currently verbally contracts for safety on unit    Discharge Planning: Social work and case management to assist with discharge planning and identification of hospital follow-up needs prior to discharge Estimated LOS: 5-7 days Discharge Concerns: Need to establish a safety plan; Medication compliance and effectiveness Discharge Goals: Return home with outpatient referrals for mental health follow-up including medication management/psychotherapy.   Long Term Goal(s): Improvement in symptoms so as ready for discharge   Short Term Goals: Ability to identify changes in lifestyle to reduce recurrence of condition will improve, Ability to verbalize feelings will improve, Ability to disclose and discuss suicidal ideas, Ability to demonstrate self-control will improve, Ability to identify and develop effective coping behaviors will improve, Ability to maintain clinical measurements within normal limits will improve, Compliance with prescribed medications will improve, and Ability to identify triggers associated with substance abuse/mental health issues will improve   Physician Treatment Plan for Secondary Diagnosis: Principal Problem:   MDD (major depressive disorder)   I certify that inpatient services furnished can reasonably be expected to improve the patient's condition.     Nicholes Rough, NP 02/21/2023, 4:10 PMPatient ID: Keith Gilbert, male   DOB: 07/05/1976, 47 y.o.   MRN: [id number] Patient ID: Inaky Rives, male   DOB: [birth year], 47 y.o.   MRN: [id number]

## 2023-02-21 NOTE — BHH Group Notes (Signed)
The focus of this group is to help patients review their daily goal of treatment and discuss progress on daily workbooks. Pt was present during tonight's wrap up group.

## 2023-02-22 ENCOUNTER — Encounter (HOSPITAL_COMMUNITY): Payer: Self-pay

## 2023-02-22 LAB — LIPID PANEL
Cholesterol: 147 mg/dL (ref 0–200)
HDL: 48 mg/dL (ref >40)
LDL Cholesterol: 89 mg/dL (ref 0–99)
Total CHOL/HDL Ratio: 3.1 RATIO
Triglycerides: 52 mg/dL (ref <150)
VLDL: 10 mg/dL (ref 0–40)

## 2023-02-22 LAB — BASIC METABOLIC PANEL
Anion gap: 8 (ref 5–15)
BUN: 13 mg/dL (ref 6–20)
CO2: 26 mmol/L (ref 22–32)
Calcium: 9.3 mg/dL (ref 8.9–10.3)
Chloride: 104 mmol/L (ref 98–111)
Creatinine, Ser: 1.32 mg/dL — ABNORMAL HIGH (ref 0.61–1.24)
GFR, Estimated: >60 mL/min (ref >60)
Glucose, Bld: 105 mg/dL — ABNORMAL HIGH (ref 70–99)
Potassium: 3.7 mmol/L (ref 3.5–5.1)
Sodium: 138 mmol/L (ref 135–145)

## 2023-02-22 LAB — TSH: TSH: 1.477 u[IU]/mL (ref 0.350–4.500)

## 2023-02-22 LAB — VITAMIN D 25 HYDROXY (VIT D DEFICIENCY, FRACTURES): Vit D, 25-Hydroxy: 35.06 ng/mL (ref 30–100)

## 2023-02-22 LAB — LITHIUM LEVEL: Lithium Lvl: 0.48 mmol/L — ABNORMAL LOW (ref 0.60–1.20)

## 2023-02-22 MED ORDER — DOCUSATE SODIUM 100 MG PO CAPS
100.0000 mg | ORAL_CAPSULE | Freq: Every day | ORAL | Status: DC
  Administered 2023-02-22 – 2023-02-23 (×2): 100 mg via ORAL
  Filled 2023-02-22 (×3): qty 1

## 2023-02-22 MED ORDER — POLYETHYLENE GLYCOL 3350 17 G PO PACK
17.0000 g | PACK | Freq: Once | ORAL | Status: AC
  Administered 2023-02-22: 17 g via ORAL
  Filled 2023-02-22: qty 1

## 2023-02-22 MED ORDER — POLYETHYLENE GLYCOL 3350 17 G PO PACK: 17.0000 g | PACK | Freq: Every day | ORAL | Status: DC | PRN

## 2023-02-22 MED ORDER — SENNOSIDES-DOCUSATE SODIUM 8.6-50 MG PO TABS
1.0000 | ORAL_TABLET | Freq: Every day | ORAL | Status: DC
  Administered 2023-02-22: 1 via ORAL
  Filled 2023-02-22 (×3): qty 1

## 2023-02-22 NOTE — BHH Counselor (Signed)
Keith Gilbert contacted CSW on 02/19/2023 and stated that the Pt was recommended for a 30-day residential treatment program instead of PHP.  The [facility] representative, Judson Roch stated that she would speak with the Pt's wife about options.    Update: on 02/22/2023 CSW spoke with the Pt's wife who states that she has scheduled her husband for admission to [facility] in [place] for 02/23/2023.  She states that she will be transporting her husband to this treatment center.  She states that she will pick her husband up at 10:00am on 02/23/2023.  This facility is a sister facility to [facility] under the [employer].

## 2023-02-22 NOTE — Progress Notes (Signed)
[facility] MD Progress Note  02/22/2023 5:29 PM Keith Gilbert  MRN:  [id number]  Reason for admission:Keith Gilbert is a 47 year old African-American male with prior psychiatric history significant for major depressive disorder recurrent severe, and bipolar disorder, who presented voluntarily to [facility] from [facility] ED for worsening depressive symptoms resulting in suicidal ideation in the context of losing his job on Monday, 02/15/2023.  Patient was examined and stabilize at [facility], ED prior to admission at [facility] for further evaluation of his mental health disorder and treatment.     24-hour chart review: V/S WNL earlier in the shift with a slight  elevation in HR at 102. Pt is remaining compliant with medications, no PRN medications given overnight. No behavioral episodes noted in the past 24 hrs. Has been staying isolative to his room and not attending unit group sessions.  Today's assessment 02/22/23: Pt presents with a slightly less depressed mood than yesterday, smiles occasionally through entire assessment. His attention to personal hygiene and grooming is fair, eye contact is good, speech is clear & coherent. Thought contents are organized and logical, and pt currently denies SI/HI/AVH or paranoia. There is no evidence of delusional thoughts.  He reports that his appetite is fair, reports a good sleep quality last night, denies being in any physical pain, but points to his shin area, and states to Probation officer and NP student that the area is "scaly". Area observed and nothing observed there. Skin is dry, and will order Eucerin cream for area. Pt also complaining of "foamy urine", and frequent urination. Will order a UA to rule out a UTI. He complained of no BM since Wednesday or Thursday last week. Colace daily ordered, and Miralax PRN ordered for constipation.   Pt continues to report being depressed, rates anxiety as 6, 10 being worst, rates anxiety as 7, 10 being  worst.  This is a slight improvement from yesterday.  He however verbalizes readiness for discharge tomorrow so that he can go to inpatient which is more longer term than our unit.  She states that he is looking forward to this.  Tentative plan is to discharge him tomorrow, and his wife is to pick him up for transportation to the program which is 4 hours away in [place].  Principal Problem: Severe bipolar I disorder, current or most recent episode depressed ([place])  Diagnosis: Principal Problem:   Severe bipolar I disorder, current or most recent episode depressed ([place]) Active Problems:   Generalized anxiety disorder  Total Time spent with patient: 30 minutes  Past Psychiatric History:   Past Medical History:  Past Medical History:  Diagnosis Date   Anxiety    Bipolar 1 disorder ([place])    Depression     Past Surgical History:  Procedure Laterality Date   None     Family History:  Family History  Problem Relation Age of Onset   Heart disease Mother    Heart disease Father    Family Psychiatric  History: Psych: Maternal side depression Psych Rx: Yes SA/HA: Denies Substance use family hx: Patient denies  Social History:  Social History   Substance and Sexual Activity  Alcohol Use Yes     Social History   Substance and Sexual Activity  Drug Use Yes   Frequency: 4.0 times per week   Types: Marijuana    Social History   Socioeconomic History   Marital status: Married    Spouse name: Not on file   Number of children:  Not on file   Years of education: Not on file   Highest education level: Not on file  Occupational History   Not on file  Tobacco Use   Smoking status: Never   Smokeless tobacco: Never  Substance and Sexual Activity   Alcohol use: Yes   Drug use: Yes    Frequency: 4.0 times per week    Types: Marijuana   Sexual activity: Yes  Other Topics Concern   Not on file  Social History Narrative   Not on file   Social Determinants of Health    Financial Resource Strain: Not on file  Food Insecurity: Patient Declined (02/16/2023)   Hunger Vital Sign    Worried About Running Out of Food in the Last Year: Patient declined    [place] in the Last Year: Patient declined  Transportation Needs: No Transportation Needs (02/16/2023)   PRAPARE - Hydrologist (Medical): No    Lack of Transportation (Non-Medical): No  Physical Activity: Not on file  Stress: Not on file  Social Connections: Not on file   Additional Social History:    Sleep: Good  Appetite:  Good  Current Medications: Current Facility-Administered Medications  Medication Dose Route Frequency Provider Last Rate Last Admin   acetaminophen (TYLENOL) tablet 650 mg  650 mg Oral Q6H PRN Vesta Mixer, NP       alum & mag hydroxide-simeth (MAALOX/MYLANTA) 200-200-20 MG/5ML suspension 30 mL  30 mL Oral Q4H PRN Vesta Mixer, NP       diphenhydrAMINE (BENADRYL) capsule 50 mg  50 mg Oral TID PRN Vesta Mixer, NP       Or   diphenhydrAMINE (BENADRYL) injection 50 mg  50 mg Intramuscular TID PRN Vesta Mixer, NP       docusate sodium (COLACE) capsule 100 mg  100 mg Oral Daily Nicholes Rough, NP   100 mg at 02/22/23 1244   haloperidol (HALDOL) tablet 5 mg  5 mg Oral TID PRN Vesta Mixer, NP       Or   haloperidol lactate (HALDOL) injection 5 mg  5 mg Intramuscular TID PRN Vesta Mixer, NP       hydrOXYzine (ATARAX) tablet 25 mg  25 mg Oral TID PRN Vesta Mixer, NP   25 mg at 02/21/23 2124   lactase (LACTAID) tablet 6,000 Units  6,000 Units Oral PRN Ranae Palms, MD   6,000 Units at 02/22/23 1715   lithium carbonate (ESKALITH) ER tablet 450 mg  450 mg Oral QHS NtuenKris Hartmann, FNP   450 mg at 02/21/23 2124   lithium carbonate (LITHOBID) ER tablet 300 mg  300 mg Oral Q lunch Ntuen, Tina C, FNP   300 mg at 02/22/23 1243   LORazepam (ATIVAN) tablet 2 mg  2 mg Oral TID PRN Vesta Mixer, NP       Or   LORazepam  (ATIVAN) injection 2 mg  2 mg Intramuscular TID PRN Vesta Mixer, NP       lurasidone (LATUDA) tablet 80 mg  80 mg Oral QAC supper Vesta Mixer, NP   80 mg at 02/22/23 1714   magnesium hydroxide (MILK OF MAGNESIA) suspension 30 mL  30 mL Oral Daily PRN Vesta Mixer, NP   30 mL at 02/17/23 1001   polyethylene glycol (MIRALAX / GLYCOLAX) packet 17 g  17 g Oral Daily PRN Katessa Attridge, Tamela Oddi, NP       senna-docusate (Senokot-S) tablet 1 tablet  1 tablet Oral QHS Tayna Smethurst,  Beauden Tremont, NP       traZODone (DESYREL) tablet 50 mg  50 mg Oral QHS PRN Vesta Mixer, NP        Lab Results:  Results for orders placed or performed during the hospital encounter of 02/16/23 (from the past 48 hour(s))  Basic metabolic panel     Status: Abnormal   Collection Time: 02/22/23  6:41 AM  Result Value Ref Range   Sodium 138 135 - 145 mmol/L   Potassium 3.7 3.5 - 5.1 mmol/L   Chloride 104 98 - 111 mmol/L   CO2 26 22 - 32 mmol/L   Glucose, Bld 105 (H) 70 - 99 mg/dL    Comment: Glucose reference range applies only to samples taken after fasting for at least 8 hours.   BUN 13 6 - 20 mg/dL   Creatinine, Ser 1.32 (H) 0.61 - 1.24 mg/dL   Calcium 9.3 8.9 - 10.3 mg/dL   GFR, Estimated >60 >60 mL/min    Comment: (NOTE) Calculated using the CKD-EPI Creatinine Equation (2021)    Anion gap 8 5 - 15    Comment: Performed at [facility], [place] [address] [place], [place] [address]  TSH     Status: None   Collection Time: 02/22/23  6:41 AM  Result Value Ref Range   TSH 1.477 0.350 - 4.500 uIU/mL    Comment: Performed by a 3rd Generation assay with a functional sensitivity of <=0.01 uIU/mL. Performed at [facility], [place] [address], [place], [place] [address]   Lipid panel     Status: None   Collection Time: 02/22/23  6:41 AM  Result Value Ref Range   Cholesterol 147 0 - 200 mg/dL   Triglycerides 52 <150 mg/dL   HDL 48 >40 mg/dL   Total CHOL/HDL Ratio 3.1 RATIO   VLDL 10 0  - 40 mg/dL   LDL Cholesterol 89 0 - 99 mg/dL    Comment:        Total Cholesterol/HDL:CHD Risk Coronary Heart Disease Risk Table                     Men   Women  1/2 Average Risk   3.4   3.3  Average Risk       5.0   4.4  2 X Average Risk   9.6   7.1  3 X Average Risk  23.4   11.0        Use the calculated Patient Ratio above and the CHD Risk Table to determine the patient's CHD Risk.        ATP III CLASSIFICATION (LDL):  <100     mg/dL   Optimal  100-129  mg/dL   Near or Above                    Optimal  130-159  mg/dL   Borderline  160-189  mg/dL   High  >190     mg/dL   Very High Performed at [facility] [address], [place], [place] [address]   VITAMIN D 25 Hydroxy (Vit-D Deficiency, Fractures)     Status: None   Collection Time: 02/22/23  6:41 AM  Result Value Ref Range   Vit D, 25-Hydroxy 35.06 30 - 100 ng/mL    Comment: (NOTE) Vitamin D deficiency has been defined by the [place] practice guideline as a level of serum 25-OH  vitamin D  less than 20 ng/mL (1,2). The Endocrine Society went on to  further define vitamin D insufficiency as a level between 21 and 29  ng/mL (2).  1. IOM (Institute of Medicine). 2010. Dietary reference intakes for  calcium and D. [place]: The [employer]. 2. Holick MF, Binkley Arvin, Bischoff-Ferrari HA, et al. Evaluation,  treatment, and prevention of vitamin D deficiency: an Endocrine  Society clinical practice guideline, JCEM. 2011 Jul; 96(7): 1911-30.  Performed at [facility], [place] [address] [place], [place] [address]   Lithium level     Status: Abnormal   Collection Time: 02/22/23  6:41 AM  Result Value Ref Range   Lithium Lvl 0.48 (L) 0.60 - 1.20 mmol/L    Comment: Performed at [facility], [place] [address], [place], [place] [address]     Blood Alcohol level:  Lab Results  Component Value Date   ETH <10 0000000     Metabolic Disorder Labs: No results found for: "HGBA1C", "MPG" No results found for: "PROLACTIN" Lab Results  Component Value Date   CHOL 147 02/22/2023   TRIG 52 02/22/2023   HDL 48 02/22/2023   CHOLHDL 3.1 02/22/2023   VLDL 10 02/22/2023   LDLCALC 89 02/22/2023    Physical Findings: AIMS: Facial and Oral Movements Muscles of Facial Expression: None, normal Lips and Perioral Area: None, normal Jaw: None, normal Tongue: None, normal,Extremity Movements Upper (arms, wrists, hands, fingers): None, normal Lower (legs, knees, ankles, toes): None, normal, Trunk Movements Neck, shoulders, hips: None, normal, Overall Severity Severity of abnormal movements (highest score from questions above): None, normal Incapacitation due to abnormal movements: None, normal Patient's awareness of abnormal movements (rate only patient's report): No Awareness, Dental Status Current problems with teeth and/or dentures?: No Does patient usually wear dentures?: No  CIWA:    COWS:     Musculoskeletal: Strength & Muscle Tone: within normal limits Gait & Station: normal Patient leans: N/A  Psychiatric Specialty Exam:  Presentation  General Appearance:  Appropriate for Environment; Fairly Groomed  Eye Contact: Good  Speech: Clear and Coherent  Speech Volume: Normal  Handedness: Right  Mood and Affect  Mood: Depressed  Affect: Congruent  Thought Process  Thought Processes: Coherent  Descriptions of Associations:Intact  Orientation:Full (Time, Place and Person)  Thought Content:Logical  History of Schizophrenia/Schizoaffective disorder:No  Duration of Psychotic Symptoms:No data recorded Hallucinations:Hallucinations: None  Ideas of Reference:None  Suicidal Thoughts:Suicidal Thoughts: No  Homicidal Thoughts:Homicidal Thoughts: No  Sensorium  Memory: Immediate Good  Judgment: Fair  Insight: Fair  Community education officer  Concentration: Good  Attention  Span: Good  Recall: Good  Fund of Knowledge: Good  Language: Good  Psychomotor Activity  Psychomotor Activity: Psychomotor Activity: Normal  Assets  Assets: Communication Skills; Social Support; Resilience  Sleep  Sleep: Sleep: Good  Physical Exam: Physical Exam Vitals and nursing note reviewed.  HENT:     Head: Normocephalic.     Nose: Nose normal.     Mouth/Throat:     Mouth: Mucous membranes are moist.     Pharynx: Oropharynx is clear.  Eyes:     Conjunctiva/sclera: Conjunctivae normal.     Pupils: Pupils are equal, round, and reactive to light.  Cardiovascular:     Rate and Rhythm: Normal rate.     Pulses: Normal pulses.  Pulmonary:     Effort: Pulmonary effort is normal.  Abdominal:     Palpations: Abdomen is soft.  Genitourinary:    Comments: Deferred Musculoskeletal:  General: Normal range of motion.     Cervical back: Normal range of motion.  Skin:    General: Skin is warm.  Neurological:     General: No focal deficit present.     Mental Status: He is alert and oriented to person, place, and time.  Psychiatric:        Behavior: Behavior normal.    Review of Systems  Constitutional: Negative.   HENT: Negative.    Eyes: Negative.   Respiratory: Negative.    Cardiovascular: Negative.   Gastrointestinal: Negative.   Genitourinary: Negative.   Musculoskeletal: Negative.   Skin: Negative.   Neurological: Negative.   Endo/Heme/Allergies: Negative.   Psychiatric/Behavioral:  Positive for depression. Negative for hallucinations, memory loss, substance abuse and suicidal ideas. The patient is nervous/anxious and has insomnia.    Blood pressure (!) 137/93, pulse 99, temperature 98.1 F (36.7 C), resp. rate 20, height 5\' 10"  (1.778 m), weight 98.2 kg, SpO2 97 %. Body mass index is 31.06 kg/m.  Treatment Plan Summary: Daily contact with patient to assess and evaluate symptoms and progress in treatment and Medication  management  Observation Level/Precautions:  15 minute checks  Laboratory:  CBC Chemistry Profile HbAIC UDS UA  Psychotherapy: Therapeutic milieu  Medications: See MAR  Consultations: Social work  Discharge Concerns: Safety  Estimated LOS: 5 to 7 days  Other:  N/A    Labs:             CMP: Glucose 114 high, creatinine 1.40 high, otherwise normal             CBC: Within normal limits Lithium level: 0.23 low, increase lithium to 450 mg p.o. twice daily will recheck level 02/22/23 UDS: Negative   EKG: NSR; ventricular rate 79; QT /QTc 368/421   Diagnoses -Bipolar 1 d/o current episode depressed -GAD  Medications: --Continue lithium 450 mg nightly for mood stabilization --Continue lithium 350 mg in the mornings for mood stabilization --Continue Latuda tablet 80 mg daily before supper for bipolar depression --Continue trazodone tablet 50 mg p.o. as needed for sleep --Continue hydroxyzine 25 mg p.o. 3 times daily as needed anxiety -Start Senna-Colace nightly for constipation -Start Miralax daily PRN for constipation  Agitation protocol: Benadryl capsule 50 mg p.o. 3 times daily as needed agitation or Benadryl injection 50 mg IM 3 times daily as needed agitation   Haldol tablets 5 mg po 3 times daily as needed agitation or Haldol lactate injection 5 mg IM 3 times daily as needed agitation   Lorazepam tablet 2 mg p.o. 3 times daily as needed agitation or Lorazepam injection 2 mg IM 3 times daily as needed agitation   Other PRN Medications -Acetaminophen 650 mg every 6 as needed/mild pain -Maalox 30 mL oral every 4 as needed/digestion -Magnesium hydroxide 30 mL daily as needed/mild constipation  ANTIPSYCHOTIC CONSENT : We discussed the risks, benefits, side effects, and alternatives to THE PRESCRIBED ANTIPSYCHOTIC, including but not limited to, the risk of fatigue, sedation, metabolic syndrome, weight gain, movement abnormalities such as tremor & cogwheeling & tardive  dyskinesia, temperature sensitivity, photosensitivity, blood pressure changes, heart rhythm effects, potential for medication interactions, and to not take these medications with alcohol or illicit drugs; informed consent was obtained. We discussed the necessity for routine monitoring including rating scales of abnormal movements, blood work, and ekgs, while the patient is prescribed antipsychotic medication.   Safety and Monitoring: Voluntary admission to inpatient psychiatric unit for safety, stabilization and treatment Daily contact with patient to assess  and evaluate symptoms and progress in treatment Patient's case to be discussed in multi-disciplinary team meeting Observation Level : q15 minute checks Vital signs: q12 hours Precautions: suicide, but pt currently verbally contracts for safety on unit    Discharge Planning: Social work and case management to assist with discharge planning and identification of hospital follow-up needs prior to discharge Estimated LOS: 5-7 days Discharge Concerns: Need to establish a safety plan; Medication compliance and effectiveness Discharge Goals: Return home with outpatient referrals for mental health follow-up including medication management/psychotherapy.   Long Term Goal(s): Improvement in symptoms so as ready for discharge   Short Term Goals: Ability to identify changes in lifestyle to reduce recurrence of condition will improve, Ability to verbalize feelings will improve, Ability to disclose and discuss suicidal ideas, Ability to demonstrate self-control will improve, Ability to identify and develop effective coping behaviors will improve, Ability to maintain clinical measurements within normal limits will improve, Compliance with prescribed medications will improve, and Ability to identify triggers associated with substance abuse/mental health issues will improve   Physician Treatment Plan for Secondary Diagnosis: Principal Problem:   MDD (major  depressive disorder)   I certify that inpatient services furnished can reasonably be expected to improve the patient's condition.     Nicholes Rough, NP 02/22/2023, 5:29 PMPatient ID: [name], male   DOB: 02-Sep-1976, 47 y.o.   MRN: [id number] Patient ID: Joniel Kokoszka, male   DOB: 07-17-1976, 47 y.o.   MRN: [id number] Patient ID: Zalmen Sadek, male   DOB: 01-25-1976, 47 y.o.   MRN: [id number]

## 2023-02-22 NOTE — Progress Notes (Signed)
   02/22/23 0900  Psych Admission Type (Psych Patients Only)  Admission Status Voluntary  Psychosocial Assessment  Patient Complaints Depression  Eye Contact Fair  Facial Expression Flat  Affect Anxious;Sad  Speech Logical/coherent  Interaction Minimal  Motor Activity Slow  Appearance/Hygiene Unremarkable  Behavior Characteristics Cooperative;Appropriate to situation;Anxious  Mood Depressed;Anxious  Thought Process  Coherency WDL  Content WDL  Delusions None reported or observed  Perception WDL  Hallucination None reported or observed  Judgment Impaired  Confusion None  Danger to Self  Current suicidal ideation? Denies  Agreement Not to Harm Self Yes  Description of Agreement Verbal  Danger to Others  Danger to Others None reported or observed  Danger to Others Abnormal  Harmful Behavior to others No threats or harm toward other people

## 2023-02-22 NOTE — BH IP Treatment Plan (Signed)
Interdisciplinary Treatment and Diagnostic Plan Update  02/22/2023 Time of Session: 10:15am  Keith Gilbert MRN: [id number]  Principal Diagnosis: Severe bipolar I disorder, current or most recent episode depressed ([place])  Secondary Diagnoses: Principal Problem:   Severe bipolar I disorder, current or most recent episode depressed ([place]) Active Problems:   Generalized anxiety disorder   Current Medications:  Current Facility-Administered Medications  Medication Dose Route Frequency Provider Last Rate Last Admin   acetaminophen (TYLENOL) tablet 650 mg  650 mg Oral Q6H PRN Vesta Mixer, NP       alum & mag hydroxide-simeth (MAALOX/MYLANTA) 200-200-20 MG/5ML suspension 30 mL  30 mL Oral Q4H PRN Vesta Mixer, NP       diphenhydrAMINE (BENADRYL) capsule 50 mg  50 mg Oral TID PRN Vesta Mixer, NP       Or   diphenhydrAMINE (BENADRYL) injection 50 mg  50 mg Intramuscular TID PRN Vesta Mixer, NP       docusate sodium (COLACE) capsule 100 mg  100 mg Oral Daily Nicholes Rough, NP   100 mg at 02/22/23 1244   haloperidol (HALDOL) tablet 5 mg  5 mg Oral TID PRN Vesta Mixer, NP       Or   haloperidol lactate (HALDOL) injection 5 mg  5 mg Intramuscular TID PRN Vesta Mixer, NP       hydrOXYzine (ATARAX) tablet 25 mg  25 mg Oral TID PRN Vesta Mixer, NP   25 mg at 02/21/23 2124   lactase (LACTAID) tablet 6,000 Units  6,000 Units Oral PRN Ranae Palms, MD   6,000 Units at 02/22/23 0744   lithium carbonate (ESKALITH) ER tablet 450 mg  450 mg Oral QHS NtuenKris Hartmann, FNP   450 mg at 02/21/23 2124   lithium carbonate (LITHOBID) ER tablet 300 mg  300 mg Oral Q lunch Ntuen, Tina C, FNP   300 mg at 02/22/23 1243   LORazepam (ATIVAN) tablet 2 mg  2 mg Oral TID PRN Vesta Mixer, NP       Or   LORazepam (ATIVAN) injection 2 mg  2 mg Intramuscular TID PRN Vesta Mixer, NP       lurasidone (LATUDA) tablet 80 mg  80 mg Oral QAC supper Vesta Mixer, NP   80 mg at 02/21/23  1811   magnesium hydroxide (MILK OF MAGNESIA) suspension 30 mL  30 mL Oral Daily PRN Vesta Mixer, NP   30 mL at 02/17/23 1001   traZODone (DESYREL) tablet 50 mg  50 mg Oral QHS PRN Vesta Mixer, NP       PTA Medications: Medications Prior to Admission  Medication Sig Dispense Refill Last Dose   Efinaconazole 10 % SOLN Apply 1 drop topically daily. (Patient not taking: Reported on 02/16/2023) 4 mL 2    erythromycin ophthalmic ointment Place a 1/2 inch ribbon of ointment into the left lower eyelid BID prn. (Patient not taking: Reported on 02/16/2023) 3.5 g 0    lithium carbonate (ESKALITH) 450 MG ER tablet Take 450 mg by mouth at bedtime.      lurasidone (LATUDA) 80 MG TABS tablet Take 80 mg by mouth at bedtime.      Multiple Vitamin (MULTIVITAMIN) tablet Take 1 tablet by mouth daily.      sildenafil (VIAGRA) 100 MG tablet Take 1 tablet (100 mg total) by mouth daily as needed for erectile dysfunction. (Patient not taking: Reported on 02/16/2023) 10 tablet 11     Patient Stressors: Financial difficulties   Occupational concerns   Traumatic  event    Patient Strengths: Capable of independent living  Communication skills  Physical Health  Religious Affiliation  Supportive family/friends  Work skills   Treatment Modalities: Medication Management, Group therapy, Case management,  1 to 1 session with clinician, Psychoeducation, Recreational therapy.   Physician Treatment Plan for Primary Diagnosis: Severe bipolar I disorder, current or most recent episode depressed ([place]) Long Term Goal(s): Improvement in symptoms so as ready for discharge   Short Term Goals: Ability to identify changes in lifestyle to reduce recurrence of condition will improve Ability to verbalize feelings will improve Ability to disclose and discuss suicidal ideas Ability to demonstrate self-control will improve Ability to identify and develop effective coping behaviors will improve Ability to maintain clinical  measurements within normal limits will improve Compliance with prescribed medications will improve Ability to identify triggers associated with substance abuse/mental health issues will improve  Medication Management: Evaluate patient's response, side effects, and tolerance of medication regimen.  Therapeutic Interventions: 1 to 1 sessions, Unit Group sessions and Medication administration.  Evaluation of Outcomes: Progressing  Physician Treatment Plan for Secondary Diagnosis: Principal Problem:   Severe bipolar I disorder, current or most recent episode depressed ([place]) Active Problems:   Generalized anxiety disorder  Long Term Goal(s): Improvement in symptoms so as ready for discharge   Short Term Goals: Ability to identify changes in lifestyle to reduce recurrence of condition will improve Ability to verbalize feelings will improve Ability to disclose and discuss suicidal ideas Ability to demonstrate self-control will improve Ability to identify and develop effective coping behaviors will improve Ability to maintain clinical measurements within normal limits will improve Compliance with prescribed medications will improve Ability to identify triggers associated with substance abuse/mental health issues will improve     Medication Management: Evaluate patient's response, side effects, and tolerance of medication regimen.  Therapeutic Interventions: 1 to 1 sessions, Unit Group sessions and Medication administration.  Evaluation of Outcomes: Progressing   RN Treatment Plan for Primary Diagnosis: Severe bipolar I disorder, current or most recent episode depressed ([place]) Long Term Goal(s): Knowledge of disease and therapeutic regimen to maintain health will improve  Short Term Goals: Ability to remain free from injury will improve, Ability to participate in decision making will improve, Ability to verbalize feelings will improve, Ability to disclose and discuss suicidal ideas, and  Ability to identify and develop effective coping behaviors will improve  Medication Management: RN will administer medications as ordered by provider, will assess and evaluate patient's response and provide education to patient for prescribed medication. RN will report any adverse and/or side effects to prescribing provider.  Therapeutic Interventions: 1 on 1 counseling sessions, Psychoeducation, Medication administration, Evaluate responses to treatment, Monitor vital signs and CBGs as ordered, Perform/monitor CIWA, COWS, AIMS and Fall Risk screenings as ordered, Perform wound care treatments as ordered.  Evaluation of Outcomes: Progressing   LCSW Treatment Plan for Primary Diagnosis: Severe bipolar I disorder, current or most recent episode depressed ([place]) Long Term Goal(s): Safe transition to appropriate next level of care at discharge, Engage patient in therapeutic group addressing interpersonal concerns.  Short Term Goals: Engage patient in aftercare planning with referrals and resources, Increase social support, Increase emotional regulation, Facilitate acceptance of mental health diagnosis and concerns, Identify triggers associated with mental health/substance abuse issues, and Increase skills for wellness and recovery  Therapeutic Interventions: Assess for all discharge needs, 1 to 1 time with Social worker, Explore available resources and support systems, Assess for adequacy in community support network, Educate  family and significant other(s) on suicide prevention, Complete Psychosocial Assessment, Interpersonal group therapy.  Evaluation of Outcomes: Progressing   Progress in Treatment: Attending groups: Yes. Participating in groups: Yes. Taking medication as prescribed: Yes. Toleration medication: Yes. Family/Significant other contact made: Yes, individual(s) contacted:  Wife  Patient understands diagnosis: Yes. Discussing patient identified problems/goals with staff:  Yes. Medical problems stabilized or resolved: Yes. Denies suicidal/homicidal ideation: Yes. Issues/concerns per patient self-inventory: No.     New problem(s) identified: No, Describe:  None    New Short Term/Long Term Goal(s): medication stabilization, elimination of SI thoughts, development of comprehensive mental wellness plan.    Patient Goals: "To feel better and stop having suicidal thoughts"    Discharge Plan or Barriers: Patient recently admitted. CSW will continue to follow and assess for appropriate referrals and possible discharge planning.    Reason for Continuation of Hospitalization: Anxiety Depression Medication stabilization Suicidal ideation   Estimated Length of Stay: 3 to 7 days   Last 3 [place] Suicide Severity Risk Score: Flowsheet Row Admission (Current) from 02/16/2023 in [facility] ED from 02/15/2023 in [facility] Emergency Department at [facility] ED from 12/04/2022 in [facility] [place] High Risk High Risk No Risk       Last PHQ 2/9 Scores:     No data to display          Scribe for Treatment Team: Darleen Crocker, Latanya Presser 02/22/2023 1:15 PM

## 2023-02-22 NOTE — Progress Notes (Signed)
Pt this morning denied SI/HI/AVH. Patient reported frequent urination throughout the night (about 6 times). He reported that his life is too overwhelming and has urges to "wander". When asked what "wander" meant, he stated that he feels the need to walk away from everything. Per the self inventory sheet, pt rated his depression 7/10, hopelessness 6/10, and anxiety 8/10. He wrote that his goal is to "feel better and transition to my new inpatient facility". To meet his goals, pt states trying to "focus on the positives in my life". Overall, patient has been pleasant but has been isolated to his bedroom "because the dayroom is too overwhelming".    02/22/23 0900  Psych Admission Type (Psych Patients Only)  Admission Status Voluntary  Psychosocial Assessment  Patient Complaints Depression  Eye Contact Fair  Facial Expression Flat  Affect Anxious;Sad  Speech Logical/coherent  Interaction Minimal  Motor Activity Slow  Appearance/Hygiene Unremarkable  Behavior Characteristics Cooperative;Appropriate to situation;Anxious  Mood Depressed;Anxious  Thought Process  Coherency WDL  Content WDL  Delusions None reported or observed  Perception WDL  Hallucination None reported or observed  Judgment Impaired  Confusion None  Danger to Self  Current suicidal ideation? Denies  Agreement Not to Harm Self Yes  Description of Agreement Verbal  Danger to Others  Danger to Others None reported or observed  Danger to Others Abnormal  Harmful Behavior to others No threats or harm toward other people

## 2023-02-22 NOTE — Group Note (Signed)
Recreation Therapy Group Note   Group Topic:Stress Management  Group Date: 02/22/2023 Start Time: 0935 End Time: 1000 Facilitators: Jeffrie Stander-McCall, LRT,CTRS Location: [address] Dayroom   Goal Area(s) Addresses:  Patient will actively participate in stress management techniques presented during session.  Patient will successfully identify benefit of practicing stress management post d/c.   Group Description: Guided Imagery. LRT provided education, instruction, and demonstration on practice of visualization via guided imagery. Patient was asked to participate in the technique introduced during session. LRT debriefed including topics of mindfulness, stress management and specific scenarios each patient could use these techniques. Patients were given suggestions of ways to access scripts post d/c and encouraged to explore Youtube and other apps available on smartphones, tablets, and computers.   Affect/Mood: N/A   Participation Level: Did not attend    Clinical Observations/Individualized Feedback:     Plan: Continue to engage patient in RT group sessions 2-3x/week.   Enoch Moffa-McCall, LRT,CTRS 02/22/2023 12:07 PM

## 2023-02-23 DIAGNOSIS — F314 Bipolar disorder, current episode depressed, severe, without psychotic features: Principal | ICD-10-CM

## 2023-02-23 LAB — HEMOGLOBIN A1C
Hgb A1c MFr Bld: 5.7 % — ABNORMAL HIGH (ref 4.8–5.6)
Mean Plasma Glucose: 117 mg/dL

## 2023-02-23 MED ORDER — DOCUSATE SODIUM 100 MG PO CAPS: 100.0000 mg | ORAL_CAPSULE | Freq: Every day | ORAL | 0 refills | Status: DC

## 2023-02-23 MED ORDER — LITHIUM CARBONATE ER 300 MG PO TBCR: 300.0000 mg | EXTENDED_RELEASE_TABLET | Freq: Every day | ORAL | 0 refills | Status: DC

## 2023-02-23 MED ORDER — LURASIDONE HCL 80 MG PO TABS: 80.0000 mg | ORAL_TABLET | Freq: Every day | ORAL | 0 refills | Status: DC

## 2023-02-23 MED ORDER — POLYETHYLENE GLYCOL 3350 17 G PO PACK: 17.0000 g | PACK | Freq: Every day | ORAL | 0 refills | Status: DC | PRN

## 2023-02-23 MED ORDER — LITHIUM CARBONATE ER 450 MG PO TBCR: 450.0000 mg | EXTENDED_RELEASE_TABLET | Freq: Every day | ORAL | 0 refills | Status: AC

## 2023-02-23 NOTE — Discharge Summary (Signed)
Physician Discharge Summary Note  Patient:  Keith Gilbert is an 47 y.o., male MRN:  [id number] DOB:  05/03/1976 Patient phone:  [phone] (home)  Patient address:   [address] [place] [facility] [address],  Total Time spent with patient: 30 minutes  Date of Admission:  02/16/2023 Date of Discharge: 02/23/2023  Reason for Admission:  Keith Gilbert is a 47 year old African-American male with prior psychiatric history significant for major depressive disorder recurrent severe, and bipolar disorder, who presented voluntarily to [facility] from [facility] ED for worsening depressive symptoms resulting in suicidal ideation in the context of losing his job on Monday, 02/15/2023.  Patient was examined and stabilize at [facility], ED prior to admission at [facility] for further evaluation of his mental health disorder and treatment.     Principal Problem: Severe bipolar I disorder, current or most recent episode depressed ([place]) Discharge Diagnoses: Principal Problem:   Severe bipolar I disorder, current or most recent episode depressed ([place]) Active Problems:   Generalized anxiety disorder  Past Psychiatric History: See H & P  Past Medical History:  Past Medical History:  Diagnosis Date   Anxiety    Bipolar 1 disorder ([place])    Depression     Past Surgical History:  Procedure Laterality Date   None     Family History:  Family History  Problem Relation Age of Onset   Heart disease Mother    Heart disease Father    Family Psychiatric  History: See H & P Social History:  Social History   Substance and Sexual Activity  Alcohol Use Yes     Social History   Substance and Sexual Activity  Drug Use Yes   Frequency: 4.0 times per week   Types: Marijuana    Social History   Socioeconomic History   Marital status: Married    Spouse name: Not on file   Number of children: Not on file   Years of education: Not on file   Highest education level:  Not on file  Occupational History   Not on file  Tobacco Use   Smoking status: Never   Smokeless tobacco: Never  Substance and Sexual Activity   Alcohol use: Yes   Drug use: Yes    Frequency: 4.0 times per week    Types: Marijuana   Sexual activity: Yes  Other Topics Concern   Not on file  Social History Narrative   Not on file   Social Determinants of Health   Financial Resource Strain: Not on file  Food Insecurity: Patient Declined (02/16/2023)   Hunger Vital Sign    Worried About Running Out of Food in the Last Year: Patient declined    [place] in the Last Year: Patient declined  Transportation Needs: No Transportation Needs (02/16/2023)   PRAPARE - Hydrologist (Medical): No    Lack of Transportation (Non-Medical): No  Physical Activity: Not on file  Stress: Not on file  Social Connections: Not on file   Physical Findings: AIMS: Facial and Oral Movements Muscles of Facial Expression: None, normal Lips and Perioral Area: None, normal Jaw: None, normal Tongue: None, normal,Extremity Movements Upper (arms, wrists, hands, fingers): None, normal Lower (legs, knees, ankles, toes): None, normal, Trunk Movements Neck, shoulders, hips: None, normal, Overall Severity Severity of abnormal movements (highest score from questions above): None, normal Incapacitation due to abnormal movements: None, normal Patient's awareness of abnormal movements (rate only patient's report): No  Awareness, Dental Status Current problems with teeth and/or dentures?: No Does patient usually wear dentures?: No  CIWA:    COWS:     Musculoskeletal: Strength & Muscle Tone: within normal limits Gait & Station: normal Patient leans: N/A   Psychiatric Specialty Exam:  Presentation  General Appearance:  Appropriate for Environment  Eye Contact: Good  Speech: Clear and Coherent  Speech Volume: Normal  Handedness: Right   Mood and Affect   Mood: Euthymic  Affect: Appropriate; Congruent   Thought Process  Thought Processes: Coherent  Descriptions of Associations:Intact  Orientation:Full (Time, Place and Person)  Thought Content:Logical  History of Schizophrenia/Schizoaffective disorder:No  Duration of Psychotic Symptoms:No data recorded Hallucinations:Hallucinations: None  Ideas of Reference:None  Suicidal Thoughts:Suicidal Thoughts: No  Homicidal Thoughts:Homicidal Thoughts: No   Sensorium  Memory: Immediate Good  Judgment: Fair  Insight: Fair   Community education officer  Concentration: Fair  Attention Span: Good  Recall: [place] of Knowledge: Good  Language: Good   Psychomotor Activity  Psychomotor Activity: Psychomotor Activity: Normal   Assets  Assets: Communication Skills   Sleep  Sleep: Sleep: Good    Physical Exam: Physical Exam Constitutional:      Appearance: Normal appearance.  HENT:     Head: Normocephalic.     Nose: Nose normal. No congestion.  Eyes:     Pupils: Pupils are equal, round, and reactive to light.  Pulmonary:     Effort: Pulmonary effort is normal.  Musculoskeletal:     Cervical back: Normal range of motion.  Neurological:     Mental Status: He is alert and oriented to person, place, and time.  Psychiatric:        Behavior: Behavior normal.    Review of Systems  Constitutional:  Negative for fever.  HENT:  Negative for hearing loss.   Eyes:  Negative for blurred vision.  Respiratory:  Negative for cough.   Cardiovascular:  Negative for chest pain.  Gastrointestinal:  Negative for heartburn.  Genitourinary:  Negative for dysuria.  Musculoskeletal:  Negative for myalgias.  Neurological:  Negative for dizziness.  Psychiatric/Behavioral:  Positive for depression (Denies SI, HI/AVH, contracts for safety outside of [facility]). Negative for hallucinations, memory loss, substance abuse and suicidal ideas. The patient is nervous/anxious  (resolving). The patient does not have insomnia.    Blood pressure (!) 125/90, pulse (!) 108, temperature 99.5 F (37.5 C), temperature source Oral, resp. rate 16, height 5\' 10"  (1.778 m), weight 98.2 kg, SpO2 93 %. Body mass index is 31.06 kg/m.   Social History   Tobacco Use  Smoking Status Never  Smokeless Tobacco Never   Tobacco Cessation:  N/A, patient does not currently use tobacco products   Blood Alcohol level:  Lab Results  Component Value Date   ETH <10 0000000    Metabolic Disorder Labs:  Lab Results  Component Value Date   HGBA1C 5.7 (H) 02/22/2023   MPG 117 02/22/2023   No results found for: "PROLACTIN" Lab Results  Component Value Date   CHOL 147 02/22/2023   TRIG 52 02/22/2023   HDL 48 02/22/2023   CHOLHDL 3.1 02/22/2023   VLDL 10 02/22/2023   [place] 89 02/22/2023    See Psychiatric Specialty Exam and Suicide Risk Assessment completed by Attending Physician prior to discharge.  Discharge destination:  Home  Is patient on multiple antipsychotic therapies at discharge:  No   Has Patient had three or more failed trials of antipsychotic monotherapy by history:  No  Recommended Plan  for Multiple Antipsychotic Therapies: NA   Allergies as of 02/23/2023       Reactions   Morphine Swelling   Other reaction(s): Other (See Comments)   Lactose Intolerance (gi)    Minocycline Rash        Medication List     STOP taking these medications    Efinaconazole 10 % Soln   erythromycin ophthalmic ointment   multivitamin tablet   sildenafil 100 MG tablet Commonly known as: VIAGRA       TAKE these medications      Indication  docusate sodium 100 MG capsule Commonly known as: COLACE Take 1 capsule (100 mg total) by mouth daily.  Indication: Constipation   lithium carbonate 450 MG ER tablet Commonly known as: ESKALITH Take 1 tablet (450 mg total) by mouth at bedtime. What changed: Another medication with the same name was added. Make  sure you understand how and when to take each.  Indication: Manic-Depression   lithium carbonate 300 MG ER tablet Commonly known as: LITHOBID Take 1 tablet (300 mg total) by mouth daily with lunch. What changed: You were already taking a medication with the same name, and this prescription was added. Make sure you understand how and when to take each.  Indication: Manic-Depression   lurasidone 80 MG Tabs tablet Commonly known as: LATUDA Take 1 tablet (80 mg total) by mouth daily before supper. What changed: when to take this  Indication: Depressive Phase of Manic-Depression   polyethylene glycol 17 g packet Commonly known as: MIRALAX / GLYCOLAX Take 17 g by mouth daily as needed for moderate constipation.  Indication: Constipation        Follow-up Information     [facility] Follow up.   Why: Please contact this facility after discharge to schedule outpatient therapy and medication management services. Contact information: Online Only Diplomatic Services operational officer) Email: [email or web address]  Phone: [phone]  Fax: [phone]        [place] Follow up on 02/23/2023.   Why: You are schedule for admission to this facility on 02/23/2023.  [place] provide your insurance card and discharge information at intake. Contact information: Address: [address] [place], [place]  Phone:  [phone] Fax:  [phone]               Signed: Nicholes Rough, NP 02/23/2023, 3:05 PM

## 2023-02-23 NOTE — BHH Group Notes (Signed)
Spiritual care group on grief and loss facilitated by Chaplain Katy Akaila Rambo, Bcc and Colleen Pesci, counseling intern.  Group Goal: Support / Education around grief and loss  Members engage in facilitated group support and psycho-social education.  Group Description:  Following introductions and group rules, group members engaged in facilitated group dialogue and support around topic of loss, with particular support around experiences of loss in their lives. Group Identified types of loss (relationships / self / things) and identified patterns, circumstances, and changes that precipitate losses. Reflected on thoughts / feelings around loss, normalized grief responses, and recognized variety in grief experience. Group encouraged individual reflection on safe space and on the coping skills that they are already utilizing.  Group drew on Adlerian / Rogerian and narrative framework  Patient Progress: Did not attend.  
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
## 2023-02-23 NOTE — Progress Notes (Signed)
  [facility] Adult Case Management Discharge Plan :  Will you be returning to the same living situation after discharge:  No. [facility] At discharge, do you have transportation home?: Yes,  Wife Do you have the ability to pay for your medications: Yes,  [employer]   Release of information consent forms completed and in the chart;  Patient's signature needed at discharge.  Patient to Follow up at:  Follow-up Information     [facility] Follow up.   Why: Please contact this facility after discharge to schedule outpatient therapy and medication management services. Contact information: Online Only Diplomatic Services operational officer) Email: [email or web address]  Phone: [phone]  Fax: [phone]        [facility] Follow up on 02/23/2023.   Why: You are schedule for admission to this facility on 02/23/2023.  Pleae provide your insurance card and discharge information at intake. Contact information: Address: [address] [place], [place]  Phone:  [phone] Fax:  [phone]                Next level of care provider has access to [place] and Suicide Prevention discussed: Yes,  With patient and wife      Has patient been referred to the Quitline?: N/A patient is not a smoker  Patient has been referred for addiction treatment: Yes, Treatment center for mental health and may also address marijuana use while in the facility.   Darleen Crocker, LCSWA 02/23/2023, 7:48 AM

## 2023-02-23 NOTE — Plan of Care (Signed)
  Problem: Education: Goal: Knowledge of [facility] Education information/materials will improve Outcome: Completed/Met Goal: Emotional status will improve Outcome: Completed/Met Goal: Mental status will improve Outcome: Completed/Met Goal: Verbalization of understanding the information provided will improve Outcome: Completed/Met   Problem: Activity: Goal: Interest or engagement in activities will improve Outcome: Completed/Met Goal: Sleeping patterns will improve Outcome: Completed/Met   

## 2023-02-23 NOTE — Plan of Care (Signed)
  Problem: Safety: Goal: Periods of time without injury will increase Outcome: Progressing   

## 2023-02-23 NOTE — Progress Notes (Signed)
   02/23/23 0135  Psych Admission Type (Psych Patients Only)  Admission Status Voluntary  Psychosocial Assessment  Patient Complaints Depression  Eye Contact Brief  Facial Expression Flat  Affect Anxious  Speech Logical/coherent  Interaction Minimal  Motor Activity Slow  Appearance/Hygiene Unremarkable  Behavior Characteristics Cooperative  Mood Anxious;Depressed  Thought Process  Coherency WDL  Content WDL  Delusions None reported or observed  Perception WDL  Hallucination None reported or observed  Judgment Impaired  Confusion None  Danger to Self  Current suicidal ideation? Denies  Agreement Not to Harm Self Yes  Description of Agreement Verbal  Danger to Others  Danger to Others None reported or observed  Danger to Others Abnormal  Harmful Behavior to others No threats or harm toward other people   Alert/oriented. Makes needs/concerns known to staff. Pleasant cooperative with staff. Denies SI/HI/A/V hallucinations. Med compliant. Will encourage continued compliance and progression towards goals. Verbally contracted for safety. Will continue to monitor.

## 2023-02-23 NOTE — Group Note (Signed)
Date:  02/23/2023 Time:  9:19 AM  Group Topic/Focus:  Goals Group:   The focus of this group is to help patients establish daily goals to achieve during treatment and discuss how the patient can incorporate goal setting into their daily lives to aide in recovery. Orientation:   The focus of this group is to educate the patient on the purpose and policies of crisis stabilization and provide a format to answer questions about their admission.  The group details unit policies and expectations of patients while admitted.    Participation Level:  Did Not Attend  Participation Quality:    Affect:    Cognitive:    Insight:   Engagement in Group:    Modes of Intervention:    Additional Comments:    Garvin Fila 02/23/2023, 9:19 AM

## 2023-03-27 DIAGNOSIS — Z79899 Other long term (current) drug therapy: Secondary | ICD-10-CM | POA: Diagnosis not present

## 2023-03-27 DIAGNOSIS — L309 Dermatitis, unspecified: Secondary | ICD-10-CM | POA: Diagnosis not present

## 2023-03-27 DIAGNOSIS — F319 Bipolar disorder, unspecified: Secondary | ICD-10-CM | POA: Diagnosis not present

## 2023-03-27 DIAGNOSIS — Z130 Encounter for screening for diseases of the blood and blood-forming organs and certain disorders involving the immune mechanism: Secondary | ICD-10-CM | POA: Diagnosis not present

## 2023-03-27 DIAGNOSIS — Z8042 Family history of malignant neoplasm of prostate: Secondary | ICD-10-CM | POA: Diagnosis not present

## 2023-03-29 DIAGNOSIS — F3132 Bipolar disorder, current episode depressed, moderate: Secondary | ICD-10-CM | POA: Diagnosis not present

## 2023-03-30 DIAGNOSIS — F3132 Bipolar disorder, current episode depressed, moderate: Secondary | ICD-10-CM | POA: Diagnosis not present

## 2023-03-31 DIAGNOSIS — F3132 Bipolar disorder, current episode depressed, moderate: Secondary | ICD-10-CM | POA: Diagnosis not present

## 2023-04-01 DIAGNOSIS — Z130 Encounter for screening for diseases of the blood and blood-forming organs and certain disorders involving the immune mechanism: Secondary | ICD-10-CM | POA: Diagnosis not present

## 2023-04-01 DIAGNOSIS — F319 Bipolar disorder, unspecified: Secondary | ICD-10-CM | POA: Diagnosis not present

## 2023-04-01 DIAGNOSIS — Z8042 Family history of malignant neoplasm of prostate: Secondary | ICD-10-CM | POA: Diagnosis not present

## 2023-04-01 DIAGNOSIS — F3132 Bipolar disorder, current episode depressed, moderate: Secondary | ICD-10-CM | POA: Diagnosis not present

## 2023-04-02 DIAGNOSIS — F3132 Bipolar disorder, current episode depressed, moderate: Secondary | ICD-10-CM | POA: Diagnosis not present

## 2023-04-05 DIAGNOSIS — F3132 Bipolar disorder, current episode depressed, moderate: Secondary | ICD-10-CM | POA: Diagnosis not present

## 2023-04-06 DIAGNOSIS — F3132 Bipolar disorder, current episode depressed, moderate: Secondary | ICD-10-CM | POA: Diagnosis not present

## 2023-04-07 DIAGNOSIS — F3132 Bipolar disorder, current episode depressed, moderate: Secondary | ICD-10-CM | POA: Diagnosis not present

## 2023-04-08 DIAGNOSIS — F3132 Bipolar disorder, current episode depressed, moderate: Secondary | ICD-10-CM | POA: Diagnosis not present

## 2023-04-13 DIAGNOSIS — F3132 Bipolar disorder, current episode depressed, moderate: Secondary | ICD-10-CM | POA: Diagnosis not present

## 2023-04-14 DIAGNOSIS — F3132 Bipolar disorder, current episode depressed, moderate: Secondary | ICD-10-CM | POA: Diagnosis not present

## 2023-04-15 DIAGNOSIS — F3132 Bipolar disorder, current episode depressed, moderate: Secondary | ICD-10-CM | POA: Diagnosis not present

## 2023-04-16 DIAGNOSIS — F3132 Bipolar disorder, current episode depressed, moderate: Secondary | ICD-10-CM | POA: Diagnosis not present

## 2023-04-17 DIAGNOSIS — R7303 Prediabetes: Secondary | ICD-10-CM | POA: Insufficient documentation

## 2023-04-17 DIAGNOSIS — Z23 Encounter for immunization: Secondary | ICD-10-CM | POA: Diagnosis not present

## 2023-04-17 DIAGNOSIS — L309 Dermatitis, unspecified: Secondary | ICD-10-CM | POA: Diagnosis not present

## 2023-04-17 DIAGNOSIS — Z8042 Family history of malignant neoplasm of prostate: Secondary | ICD-10-CM | POA: Diagnosis not present

## 2023-04-17 DIAGNOSIS — E739 Lactose intolerance, unspecified: Secondary | ICD-10-CM | POA: Insufficient documentation

## 2023-04-17 DIAGNOSIS — R269 Unspecified abnormalities of gait and mobility: Secondary | ICD-10-CM | POA: Diagnosis not present

## 2023-04-17 DIAGNOSIS — E669 Obesity, unspecified: Secondary | ICD-10-CM | POA: Insufficient documentation

## 2023-04-17 DIAGNOSIS — Z0001 Encounter for general adult medical examination with abnormal findings: Secondary | ICD-10-CM | POA: Diagnosis not present

## 2023-04-17 DIAGNOSIS — F319 Bipolar disorder, unspecified: Secondary | ICD-10-CM | POA: Diagnosis not present

## 2023-04-19 DIAGNOSIS — F3132 Bipolar disorder, current episode depressed, moderate: Secondary | ICD-10-CM | POA: Diagnosis not present

## 2023-04-20 DIAGNOSIS — F3132 Bipolar disorder, current episode depressed, moderate: Secondary | ICD-10-CM | POA: Diagnosis not present

## 2023-04-21 DIAGNOSIS — F3132 Bipolar disorder, current episode depressed, moderate: Secondary | ICD-10-CM | POA: Diagnosis not present

## 2023-04-22 DIAGNOSIS — F3132 Bipolar disorder, current episode depressed, moderate: Secondary | ICD-10-CM | POA: Diagnosis not present

## 2023-04-23 DIAGNOSIS — F3132 Bipolar disorder, current episode depressed, moderate: Secondary | ICD-10-CM | POA: Diagnosis not present

## 2023-04-26 DIAGNOSIS — F3132 Bipolar disorder, current episode depressed, moderate: Secondary | ICD-10-CM | POA: Diagnosis not present

## 2023-04-27 DIAGNOSIS — F3132 Bipolar disorder, current episode depressed, moderate: Secondary | ICD-10-CM | POA: Diagnosis not present

## 2023-04-28 DIAGNOSIS — F3132 Bipolar disorder, current episode depressed, moderate: Secondary | ICD-10-CM | POA: Diagnosis not present

## 2023-04-29 DIAGNOSIS — F3132 Bipolar disorder, current episode depressed, moderate: Secondary | ICD-10-CM | POA: Diagnosis not present

## 2023-04-30 DIAGNOSIS — F3132 Bipolar disorder, current episode depressed, moderate: Secondary | ICD-10-CM | POA: Diagnosis not present

## 2023-05-04 DIAGNOSIS — F3132 Bipolar disorder, current episode depressed, moderate: Secondary | ICD-10-CM | POA: Diagnosis not present

## 2023-05-05 DIAGNOSIS — F3132 Bipolar disorder, current episode depressed, moderate: Secondary | ICD-10-CM | POA: Diagnosis not present

## 2023-05-06 DIAGNOSIS — F3132 Bipolar disorder, current episode depressed, moderate: Secondary | ICD-10-CM | POA: Diagnosis not present

## 2023-05-07 DIAGNOSIS — F3132 Bipolar disorder, current episode depressed, moderate: Secondary | ICD-10-CM | POA: Diagnosis not present

## 2023-06-05 ENCOUNTER — Ambulatory Visit
Admission: EM | Admit: 2023-06-05 | Discharge: 2023-06-05 | Disposition: A | Discharge status: Home / Self Care | Payer: BC Managed Care – PPO

## 2023-06-05 ENCOUNTER — Other Ambulatory Visit: Payer: Self-pay

## 2023-06-05 ENCOUNTER — Encounter: Payer: Self-pay | Admitting: Emergency Medicine

## 2023-06-05 DIAGNOSIS — H00011 Hordeolum externum right upper eyelid: Secondary | ICD-10-CM

## 2023-06-05 MED ORDER — POLYMYXIN B-TRIMETHOPRIM 10000-0.1 UNIT/ML-% OP SOLN: 1.0000 [drp] | Freq: Four times a day (QID) | OPHTHALMIC | 0 refills | Status: DC

## 2023-06-05 NOTE — ED Triage Notes (Signed)
Pt reports was mowing yard a "week or so ago" and states something felt like it was in my right eye and now I have this "swollen and hard knot" to my upper eye lid with intermittent drainage.

## 2023-06-05 NOTE — ED Provider Notes (Signed)
[facility]    CSN: [id number] Arrival date & time: 06/05/23  1448      History   Chief Complaint Chief Complaint  Patient presents with   Eyelid Problem    HPI Keith Gilbert is a 47 y.o. male.   Patient presenting today with about a week of a hard painful red mass to the right upper eyelid medially.  States it started coming up after mowing the grass but did not injure the area to his knowledge.  Denies eye redness, eye pain, vision change, headache, drainage from the area, fever, chills.  Not tried anything so far for symptoms.    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 Authorizing Provider  ARIPiprazole (ABILIFY) 10 MG tablet Take 10 mg by mouth daily.   Yes [provider]  trimethoprim-polymyxin b (POLYTRIM) ophthalmic solution Place 1 drop into the right eye every 6 (six) hours. 06/05/23  Yes Particia Nearing, PA-C  docusate sodium (COLACE) 100 MG capsule Take 1 capsule (100 mg total) by mouth daily. 02/23/23   Starleen Blue, NP  lithium carbonate (ESKALITH) 450 MG ER tablet Take 1 tablet (450 mg total) by mouth at bedtime. 02/23/23   Starleen Blue, NP  lithium carbonate (LITHOBID) 300 MG ER tablet Take 1 tablet (300 mg total) by mouth daily with lunch. 02/23/23   Starleen Blue, NP  lurasidone (LATUDA) 80 MG TABS tablet Take 1 tablet (80 mg total) by mouth daily before supper. 02/23/23   Starleen Blue, NP   polyethylene glycol (MIRALAX / GLYCOLAX) 17 g packet Take 17 g by mouth daily as needed for moderate constipation. 02/23/23   Starleen Blue, NP    Family History Family History  Problem Relation Age of Onset   Heart disease Mother    Heart disease Father     Social History Social History   Tobacco Use   Smoking status: Never   Smokeless tobacco: Never  Substance Use Topics   Alcohol use: Yes   Drug use: Not Currently    Frequency: 4.0 times per week    Types: Marijuana     Allergies   Morphine, Lactose intolerance (gi), and Minocycline   Review of Systems Review of Systems PER HPI  Physical Exam Triage Vital Signs ED Triage Vitals [06/05/23 1459]  Enc Vitals Group     BP 114/78     Pulse Rate 89     Resp 20     Temp 98.3 F (36.8 C)     Temp Source Oral     SpO2 95 %     Weight      Height      Head Circumference      Peak Flow      Pain Score 3     Pain Loc      Pain Edu?      Excl. in GC?  No data found.  Updated Vital Signs BP 114/78 (BP Location: Right Arm)   Pulse 89   Temp 98.3 F (36.8 C) (Oral)   Resp 20   SpO2 95%   Visual Acuity Right Eye Distance:   Left Eye Distance:   Bilateral Distance:    Right Eye Near:   Left Eye Near:    Bilateral Near:     Physical Exam Vitals and nursing note reviewed.  Constitutional:      Appearance: Normal appearance.  HENT:     Head: Atraumatic.     Mouth/Throat:     Mouth: Mucous membranes are moist.  Eyes:     Extraocular Movements: Extraocular movements intact.     Conjunctiva/sclera: Conjunctivae normal.     Pupils: Pupils are equal, round, and reactive to light.     Comments: Stye present to the right upper eyelid medially  Cardiovascular:     Rate and Rhythm: Normal rate and regular rhythm.  Pulmonary:     Effort: Pulmonary effort is normal.     Breath sounds: Normal breath sounds.  Musculoskeletal:        General: Normal range of motion.     Cervical back: Normal range of  motion and neck supple.  Skin:    General: Skin is warm and dry.  Neurological:     General: No focal deficit present.     Mental Status: He is oriented to person, place, and time.  Psychiatric:        Mood and Affect: Mood normal.        Thought Content: Thought content normal.        Judgment: Judgment normal.      UC Treatments / Results  Labs (all labs ordered are listed, but only abnormal results are displayed) Labs Reviewed - No data to display  EKG   Radiology No results found.  Procedures Procedures (including critical care time)  Medications Ordered in UC Medications - No data to display  Initial Impression / Assessment and Plan / UC Course  I have reviewed the triage vital signs and the nursing notes.  Pertinent labs & imaging results that were available during my care of the patient were reviewed by me and considered in my medical decision making (see chart for details).     Treat with Polytrim drops, warm compresses, good hand hygiene.  Return for worsening symptoms.  Final Clinical Impressions(s) / UC Diagnoses   Final diagnoses:  Hordeolum externum of right upper eyelid   Discharge Instructions   None    ED Prescriptions     Medication Sig Dispense Auth. Provider   trimethoprim-polymyxin b (POLYTRIM) ophthalmic solution Place 1 drop into the right eye every 6 (six) hours. 10 mL Particia Nearing, [place]      PDMP not reviewed this encounter.   Particia Nearing, [place] 06/05/23 1544

## 2023-07-19 ENCOUNTER — Ambulatory Visit: Payer: BC Managed Care – PPO | Admitting: Dermatology

## 2023-07-26 ENCOUNTER — Other Ambulatory Visit: Payer: Self-pay | Admitting: Neurology

## 2023-07-26 ENCOUNTER — Ambulatory Visit (INDEPENDENT_AMBULATORY_CARE_PROVIDER_SITE_OTHER): Payer: BC Managed Care – PPO | Admitting: Neurology

## 2023-07-26 ENCOUNTER — Ambulatory Visit: Admission: RE | Admit: 2023-07-26 | Payer: BC Managed Care – PPO | Source: Ambulatory Visit

## 2023-07-26 ENCOUNTER — Encounter: Payer: Self-pay | Admitting: Neurology

## 2023-07-26 VITALS — BP 121/73 | HR 46 | Ht 72.0 in | Wt 228.5 lb

## 2023-07-26 DIAGNOSIS — R269 Unspecified abnormalities of gait and mobility: Secondary | ICD-10-CM | POA: Diagnosis not present

## 2023-07-26 DIAGNOSIS — M25559 Pain in unspecified hip: Secondary | ICD-10-CM

## 2023-07-26 DIAGNOSIS — R531 Weakness: Secondary | ICD-10-CM | POA: Diagnosis not present

## 2023-07-26 DIAGNOSIS — R7982 Elevated C-reactive protein (CRP): Secondary | ICD-10-CM | POA: Diagnosis not present

## 2023-07-26 DIAGNOSIS — R799 Abnormal finding of blood chemistry, unspecified: Secondary | ICD-10-CM | POA: Diagnosis not present

## 2023-07-26 DIAGNOSIS — M545 Low back pain, unspecified: Secondary | ICD-10-CM | POA: Diagnosis not present

## 2023-07-26 DIAGNOSIS — R700 Elevated erythrocyte sedimentation rate: Secondary | ICD-10-CM | POA: Diagnosis not present

## 2023-07-26 DIAGNOSIS — R748 Abnormal levels of other serum enzymes: Secondary | ICD-10-CM | POA: Diagnosis not present

## 2023-07-26 DIAGNOSIS — M25551 Pain in right hip: Secondary | ICD-10-CM | POA: Diagnosis not present

## 2023-07-26 DIAGNOSIS — M25552 Pain in left hip: Secondary | ICD-10-CM | POA: Diagnosis not present

## 2023-07-26 NOTE — Progress Notes (Signed)
Chief Complaint  Patient presents with   New Patient (Initial Visit)    Rm 12:alone slow gait:pt stated that they feel fatigued often, hard to rise from chair even when using arms and when asked to demonstrate w/out arms he still had to brace himself using knees. Pt mentioned that his wife noticed that about 9 months ago. A year and ahalf ago he had two falls and he thinks that it might be related.       ASSESSMENT AND PLAN  Keith Gilbert is a 48 y.o. male   Gait abnormality  In the setting of severe depression, excessive stress, polypharmacy treatment, bilateral hip pain,  Overall has improved, with better control of his stress, depression,  X-ray of hip, lumbar spine to rule out structural abnormality  Laboratory evaluation including TSH, CPK, inflammatory markers,  Continue moderate exercise  Only return To Clinic if there are any significant abnormality showed at above evaluation, most likely related to his mood disorder, deconditioning  DIAGNOSTIC DATA (LABS, IMAGING, TESTING) - I reviewed patient records, labs, notes, testing and imaging myself where available.   MEDICAL HISTORY:  Rashi Prevot, is a 47 year old male seen in request by her primary care from [facility]   Margo Aye, Kathleene Hazel, for evaluation of gait abnormality, initial evaluation July 26, 2023  I reviewed and summarized the referring note.PMHX. Bipolar disorder-start treatment since 2023  Patient lost his job around October 2023, then experienced worsening depression, hospitalization in March 2024 for suicidal ideation,  Also had previous hospitalization for depression in 2022 in [place],  Over the years, he was treated with different medications, the day of admission on February 16, 2023, he took 50 tablets of hydroxyzine, superficial cut to his chest,  He is now on lithium, Abilify, reported his mood depression overall is under much better control  Concurrent with his excessive stress of losing his job,  worsening depression, he noticed the gait abnormality, initially attributed to his bilateral hip pain bearing weight, overall has improved, but still not back to his normal self, moves slower, still have a hip pressure pain when bearing weight, also complains of urinary urgency  He denies visual change, denies upper and lower extremity persistent weakness,   PHYSICAL EXAM:   Vitals:   07/26/23 0824  BP: 121/73  Pulse: (!) 46  Weight: 228 lb 8 oz (103.6 kg)  Height: 6' (1.829 m)     Body mass index is 30.99 kg/m.  PHYSICAL EXAMNIATION:  Gen: NAD, conversant, well nourised, well groomed                     Cardiovascular: Regular rate rhythm, no peripheral edema, warm, nontender. Eyes: Conjunctivae clear without exudates or hemorrhage Neck: Supple, no carotid bruits. Pulmonary: Clear to auscultation bilaterally   NEUROLOGICAL EXAM:  MENTAL STATUS: Speech/cognition: Depressed looking middle-age male, mild slow reaction time,  oriented to history taking and casual conversation CRANIAL NERVES: CN II: Visual fields are full to confrontation. Pupils are round equal and briskly reactive to light. CN III, IV, VI: extraocular movement are normal. No ptosis. CN V: Facial sensation is intact to light touch CN VII: Face is symmetric with normal eye closure  CN VIII: Hearing is normal to causal conversation. CN IX, X: Phonation is normal. CN XI: Head turning and shoulder shrug are intact  MOTOR: There is no pronator drift of out-stretched arms. Muscle bulk and tone are normal. Muscle strength is normal.  REFLEXES: Reflexes are 2+ and symmetric  at the biceps, triceps, knees, and ankles. Plantar responses are flexor.  SENSORY: Intact to light touch, pinprick and vibratory sensation are intact in fingers and toes.  COORDINATION: There is no trunk or limb dysmetria noted.  GAIT/STANCE: Able to get up from seated position arm crossed, mild leaning forward cautious gait  REVIEW OF  SYSTEMS:  Full 14 system review of systems performed and notable only for as above All other review of systems were negative.   ALLERGIES: Allergies  Allergen Reactions   Morphine Swelling    Other reaction(s): Other (See Comments)   Lactose Intolerance (Gi)    Penicillins     Other Reaction(s): Not available   Minocycline Rash and Swelling    HOME MEDICATIONS: Current Outpatient Medications  Medication Sig Dispense Refill   ARIPiprazole (ABILIFY) 10 MG tablet Take 10 mg by mouth daily.     lithium carbonate (ESKALITH) 450 MG ER tablet Take 1 tablet (450 mg total) by mouth at bedtime. 30 tablet 0   No current facility-administered medications for this visit.    PAST MEDICAL HISTORY: Past Medical History:  Diagnosis Date   Anxiety    Bipolar 1 disorder (HCC)    Depression     PAST SURGICAL HISTORY: Past Surgical History:  Procedure Laterality Date   None      FAMILY HISTORY: Family History  Problem Relation Age of Onset   Heart disease Mother    Heart disease Father     SOCIAL HISTORY: Social History   Socioeconomic History   Marital status: Married    Spouse name: joy   Number of children: 1   Years of education: Not on file   Highest education level: Bachelor's degree (e.g., BA, AB, BS)  Occupational History   Not on file  Tobacco Use   Smoking status: Never   Smokeless tobacco: Never  Vaping Use   Vaping status: Never Used  Substance and Sexual Activity   Alcohol use: Not Currently   Drug use: Not Currently    Frequency: 4.0 times per week    Types: Marijuana   Sexual activity: Yes    Birth control/protection: None  Other Topics Concern   Not on file  Social History Narrative   Not on file   Social Determinants of Health   Financial Resource Strain: Low Risk  (04/29/2022)   Received from [facility], [facility]   Overall Financial Resource Strain (CARDIA)    Difficulty of Paying Living Expenses: Not very hard  Food Insecurity:  Patient Declined (02/16/2023)   Hunger Vital Sign    Worried About Running Out of Food in the Last Year: Patient declined    Ran Out of Food in the Last Year: Patient declined  Transportation Needs: No Transportation Needs (02/16/2023)   PRAPARE - Administrator, Civil Service (Medical): No    Lack of Transportation (Non-Medical): No  Physical Activity: Insufficiently Active (04/29/2022)   Received from [facility], [facility]   Exercise Vital Sign    Days of Exercise per Week: 3 days    Minutes of Exercise per Session: 40 min  Stress: Stress Concern Present (04/25/2018)   Received from [facility], [facility]   [facility] - Occupational Stress Questionnaire    Feeling of Stress : To some extent  Social Connections: Moderately Integrated (04/25/2018)   Received from [facility], [facility]   Social Connection and  Isolation Panel [NHANES]    Frequency of Communication with Friends and Family: More than three times a week    Frequency of Social Gatherings with Friends and Family: More than three times a week    Attends Religious Services: 1 to 4 times per year    Active Member of [employer] or Organizations: No    Attends Banker Meetings: Never    Marital Status: Living with partner  Intimate Partner Violence: Not At Risk (02/16/2023)   Humiliation, Afraid, Rape, and Kick questionnaire    Fear of Current or Ex-Partner: No    Emotionally Abused: No    Physically Abused: No    Sexually Abused: No      Levert Feinstein, M.D. Ph.D.  [facility] [address] [place], [place] [address] Ph: [phone] Fax: [phone]  CC:  Benita Stabile, MD [address] [place],  [place] [address]  Benita Stabile, MD

## 2023-07-27 LAB — ACETYLCHOLINE RECEPTOR, BINDING: AChR Binding Ab, Serum: <0.03 nmol/L (ref 0.00–0.24)

## 2023-07-27 LAB — SEDIMENTATION RATE: Sed Rate: 2 mm/h (ref 0–15)

## 2023-07-27 LAB — ANA W/REFLEX IF POSITIVE
Anti JO-1: <0.2 AI (ref 0.0–0.9)
Anti Nuclear Antibody (ANA): POSITIVE — AB
Centromere Ab Screen: <0.2 AI (ref 0.0–0.9)
Chromatin Ab SerPl-aCnc: <0.2 AI (ref 0.0–0.9)
ENA RNP Ab: 4.1 AI — ABNORMAL HIGH (ref 0.0–0.9)
ENA SM Ab Ser-aCnc: <0.2 AI (ref 0.0–0.9)
ENA SSA (RO) Ab: <0.2 AI (ref 0.0–0.9)
ENA SSB (LA) Ab: <0.2 AI (ref 0.0–0.9)
Scleroderma (Scl-70) (ENA) Antibody, IgG: <0.2 AI (ref 0.0–0.9)
dsDNA Ab: 1 [IU]/mL (ref 0–9)

## 2023-07-27 LAB — CK: Total CK: 184 U/L (ref 49–439)

## 2023-07-27 LAB — C-REACTIVE PROTEIN: CRP: 2 mg/L (ref 0–10)

## 2023-08-12 ENCOUNTER — Encounter (INDEPENDENT_AMBULATORY_CARE_PROVIDER_SITE_OTHER): Payer: BC Managed Care – PPO | Admitting: Neurology

## 2023-08-12 DIAGNOSIS — R531 Weakness: Secondary | ICD-10-CM | POA: Diagnosis not present

## 2023-08-12 DIAGNOSIS — R269 Unspecified abnormalities of gait and mobility: Secondary | ICD-10-CM

## 2023-08-12 NOTE — Telephone Encounter (Signed)
Please see the MyChart message reply(ies) for my assessment and plan.    This patient gave consent for this Medical Advice Message and is aware that it may result in a bill to [employer], as well as the possibility of receiving a bill for a co-payment or deductible. They are an established patient, but are not seeking medical advice exclusively about a problem treated during an in person or video visit in the last seven days. I did not recommend an in person or video visit within seven days of my reply.    I spent a total of 7 minutes cumulative time within 7 days through [employer].  Marcial Pacas, MD

## 2023-12-13 ENCOUNTER — Ambulatory Visit: Payer: BC Managed Care – PPO | Admitting: Dermatology

## 2024-01-26 ENCOUNTER — Ambulatory Visit: Payer: BC Managed Care – PPO | Admitting: Adult Health

## 2024-03-08 DIAGNOSIS — F331 Major depressive disorder, recurrent, moderate: Secondary | ICD-10-CM | POA: Diagnosis not present

## 2024-04-01 DIAGNOSIS — F331 Major depressive disorder, recurrent, moderate: Secondary | ICD-10-CM | POA: Diagnosis not present

## 2024-04-02 DIAGNOSIS — R059 Cough, unspecified: Secondary | ICD-10-CM | POA: Diagnosis not present

## 2024-04-02 DIAGNOSIS — J069 Acute upper respiratory infection, unspecified: Secondary | ICD-10-CM | POA: Diagnosis not present

## 2024-04-08 DIAGNOSIS — F331 Major depressive disorder, recurrent, moderate: Secondary | ICD-10-CM | POA: Diagnosis not present

## 2024-04-22 DIAGNOSIS — F331 Major depressive disorder, recurrent, moderate: Secondary | ICD-10-CM | POA: Diagnosis not present

## 2024-05-06 DIAGNOSIS — F331 Major depressive disorder, recurrent, moderate: Secondary | ICD-10-CM | POA: Diagnosis not present

## 2024-05-11 DIAGNOSIS — F331 Major depressive disorder, recurrent, moderate: Secondary | ICD-10-CM | POA: Diagnosis not present

## 2024-05-13 IMAGING — DX DG FINGER LITTLE 2+V*L*
3 series · 3 of 3 positions shown · non-contrast
Comparison: None Available.

CLINICAL DATA: Hyperextended finger playing basketball

EXAM:
LEFT LITTLE FINGER 2+V

[finger pa]
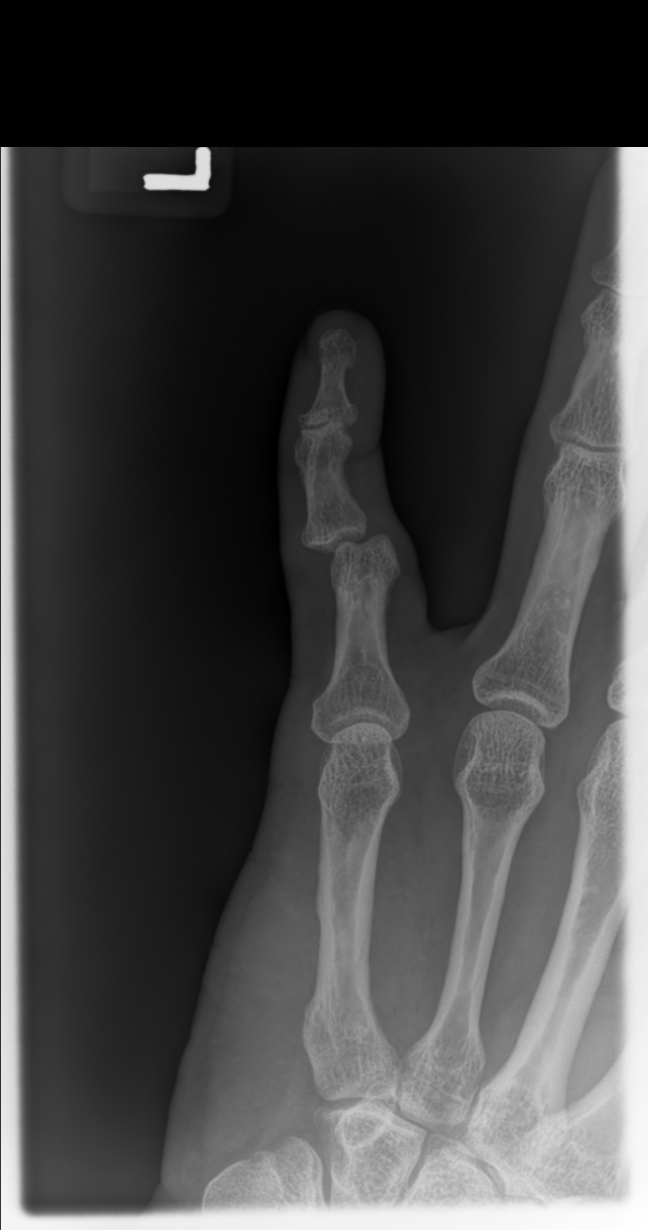

[finger mlo]
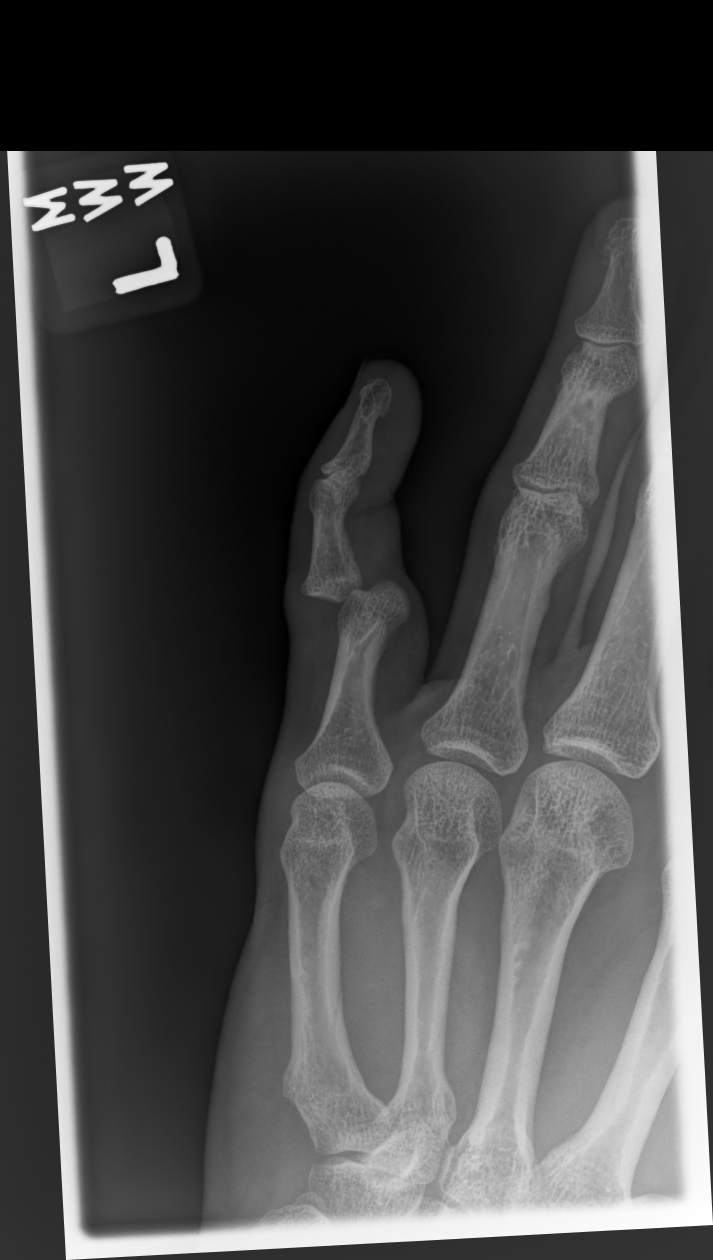

[2. finger lat]
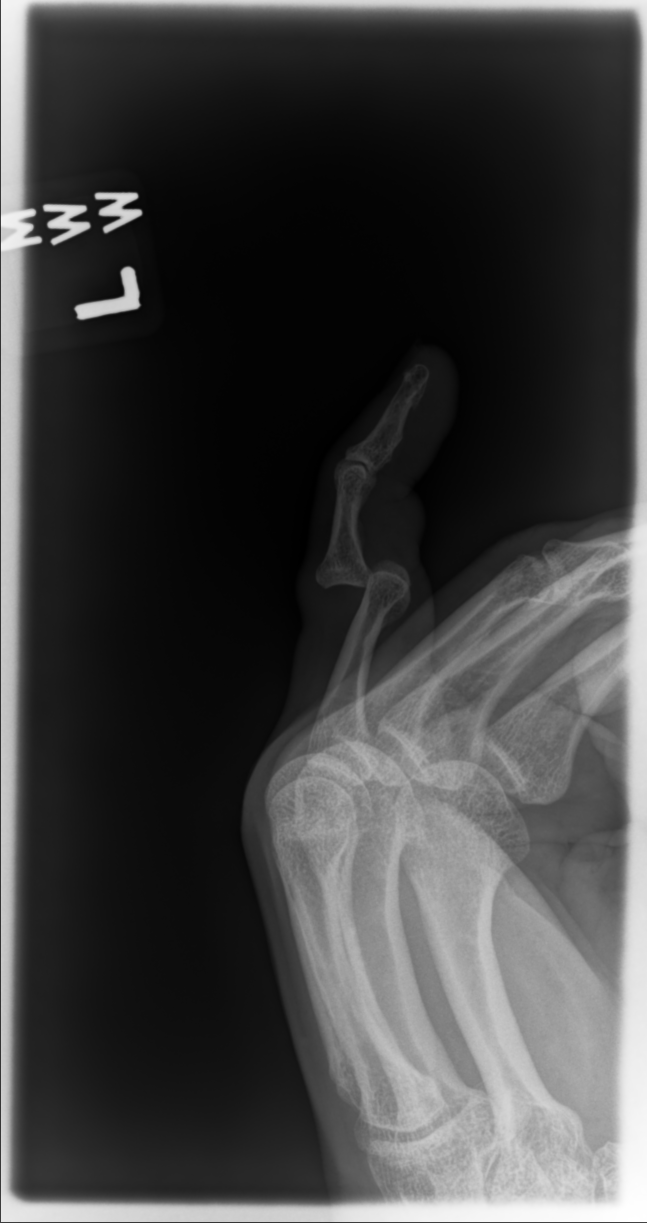

[3 of 3 positions shown; findings below may reference images not displayed]

FINDINGS: There is dorsal dislocation of the fifth digit at the proximal
interphalangeal joint. The middle phalanx is displaced 1 bone width
dorsally in relation to the proximal phalanx. No fracture.
IMPRESSION: Dislocation of the fifth digit at the PIP.

## 2024-05-13 IMAGING — DX DG FINGER LITTLE 2+V*L*
3 series · 3 of 3 positions shown · non-contrast
Comparison: Earlier radiograph dated 05/20/2022.

CLINICAL DATA: Post reduction.

EXAM:
LEFT LITTLE FINGER 2+V

[finger ap]
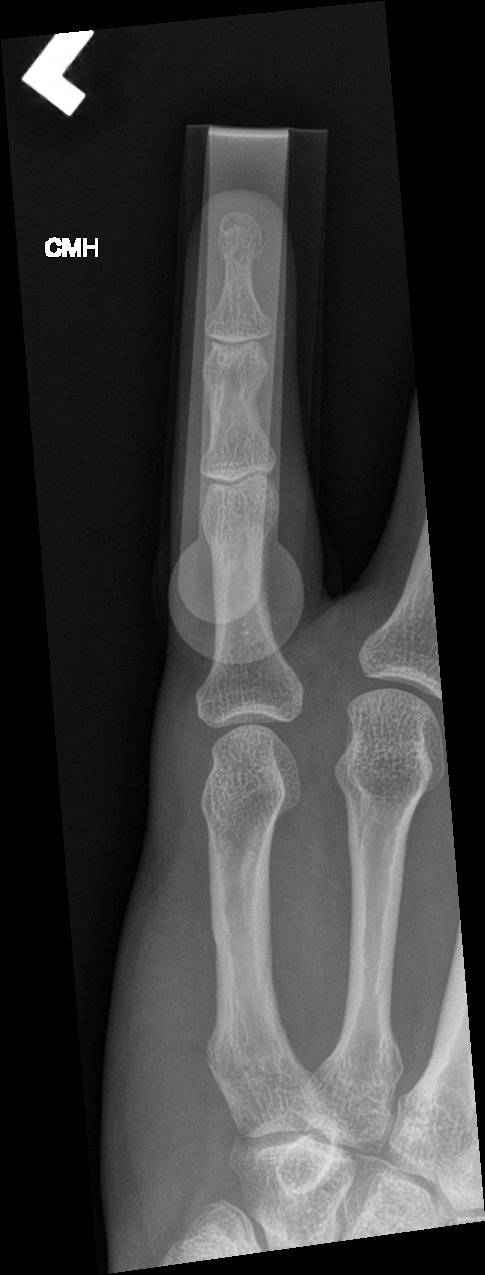

[finger obl]
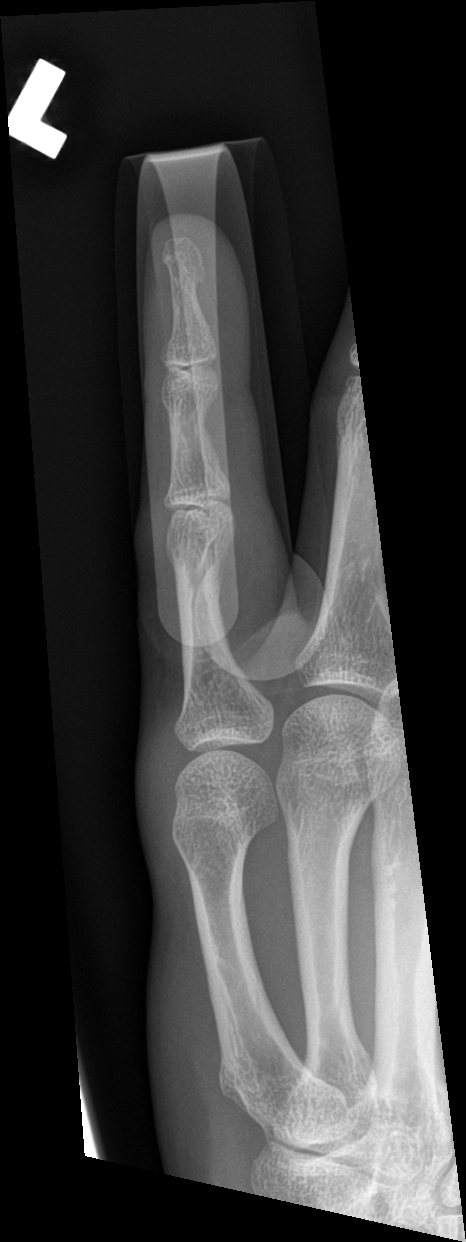

[finger lat]
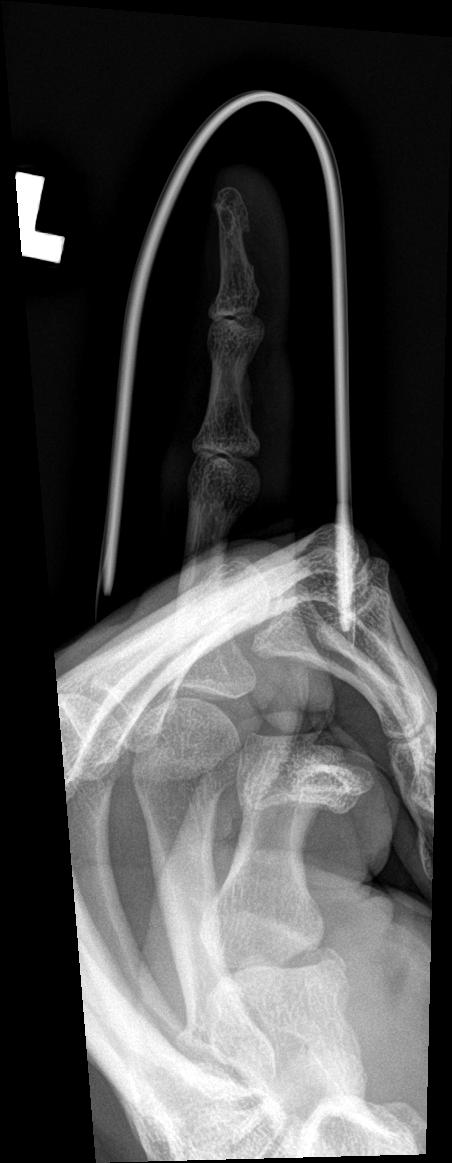

[3 of 3 positions shown; findings below may reference images not displayed]

FINDINGS: Interval reduction of the previously seen dislocated PIP joint of
the fifth digit, now in anatomic alignment. No fracture identified.
A splint has been placed.
IMPRESSION: Successful reduction of the previously seen dislocated PIP joint of
the fifth digit.

## 2024-05-19 DIAGNOSIS — F331 Major depressive disorder, recurrent, moderate: Secondary | ICD-10-CM | POA: Diagnosis not present

## 2024-05-24 DIAGNOSIS — F331 Major depressive disorder, recurrent, moderate: Secondary | ICD-10-CM | POA: Diagnosis not present

## 2024-05-29 DIAGNOSIS — F331 Major depressive disorder, recurrent, moderate: Secondary | ICD-10-CM | POA: Diagnosis not present

## 2024-06-08 DIAGNOSIS — F331 Major depressive disorder, recurrent, moderate: Secondary | ICD-10-CM | POA: Diagnosis not present

## 2024-06-20 DIAGNOSIS — F331 Major depressive disorder, recurrent, moderate: Secondary | ICD-10-CM | POA: Diagnosis not present

## 2024-07-01 DIAGNOSIS — F331 Major depressive disorder, recurrent, moderate: Secondary | ICD-10-CM | POA: Diagnosis not present

## 2024-07-05 DIAGNOSIS — R7303 Prediabetes: Secondary | ICD-10-CM | POA: Diagnosis not present

## 2024-07-05 DIAGNOSIS — Z0001 Encounter for general adult medical examination with abnormal findings: Secondary | ICD-10-CM | POA: Diagnosis not present

## 2024-07-11 DIAGNOSIS — E785 Hyperlipidemia, unspecified: Secondary | ICD-10-CM | POA: Diagnosis not present

## 2024-07-11 DIAGNOSIS — F319 Bipolar disorder, unspecified: Secondary | ICD-10-CM | POA: Diagnosis not present

## 2024-07-11 DIAGNOSIS — R768 Other specified abnormal immunological findings in serum: Secondary | ICD-10-CM | POA: Insufficient documentation

## 2024-07-11 DIAGNOSIS — R269 Unspecified abnormalities of gait and mobility: Secondary | ICD-10-CM | POA: Diagnosis not present

## 2024-07-11 DIAGNOSIS — R7303 Prediabetes: Secondary | ICD-10-CM | POA: Diagnosis not present

## 2024-08-17 ENCOUNTER — Ambulatory Visit (INDEPENDENT_AMBULATORY_CARE_PROVIDER_SITE_OTHER)

## 2024-08-17 ENCOUNTER — Ambulatory Visit
Admission: EM | Admit: 2024-08-17 | Discharge: 2024-08-17 | Disposition: A | Discharge status: Home / Self Care | Attending: Emergency Medicine | Admitting: Emergency Medicine

## 2024-08-17 ENCOUNTER — Encounter: Payer: Self-pay | Admitting: Emergency Medicine

## 2024-08-17 DIAGNOSIS — R071 Chest pain on breathing: Secondary | ICD-10-CM | POA: Diagnosis not present

## 2024-08-17 DIAGNOSIS — R0789 Other chest pain: Secondary | ICD-10-CM | POA: Diagnosis not present

## 2024-08-17 DIAGNOSIS — R079 Chest pain, unspecified: Secondary | ICD-10-CM | POA: Diagnosis not present

## 2024-08-17 MED ORDER — BACLOFEN 10 MG PO TABS: 10.0000 mg | ORAL_TABLET | Freq: Three times a day (TID) | ORAL | 0 refills | Status: AC

## 2024-08-17 MED ORDER — IBUPROFEN 600 MG PO TABS: 600.0000 mg | ORAL_TABLET | Freq: Four times a day (QID) | ORAL | 0 refills | Status: AC | PRN

## 2024-08-17 NOTE — Discharge Instructions (Addendum)
 Take the ibuprofen  600 mg every 6 hours with food to decrease inflammation and aid in pain relief.  Take the baclofen  10 mg every 8 hours to help alleviate muscle tension and also aid in pain relief.  You may apply moist heat or ice to your anterior chest wall for 20 minutes at a time 2-3 times a day as needed for pain.  Rest is much as possible and avoid any vigorous activity that taxes your chest wall and causes increase in pain.  You may also apply topical lidocaine  patches to your chest wall.  These are available over-the-counter and are good for 8 hours each.  If your symptoms do not improve, or they worsen, you may possibly need injections in your sternal joints to help alleviate the pain.  I would follow-up with orthopedics, such as [facility] if your symptoms do not improve.   If you develop any sharp increase in chest pain, especially if associated with shortness of breath, dizziness, nausea, sweating, or if the pain radiates to your left arm or left jaw you need to go to the ER for evaluation.

## 2024-08-17 NOTE — ED Provider Notes (Signed)
 [facility]    CSN: [id number] Arrival date & time: 08/17/24  1024      History   Chief Complaint Chief Complaint  Patient presents with   Chest Pain    HPI Keith Gilbert is a 48 y.o. male.   HPI  48 year old male with past medical history significant for bipolar disorder, eczema, hyperlipidemia, prediabetes, and positive ANA presents for evaluation of acute onset of left-sided chest pain with radiation through to his back.  It started 3 hours ago while he was sitting in bed.  He denies any shortness of breath, wheezing, cough, sweating, or nausea.  He rates the pain as a 7 out of 10.  It does increase with movement.  He did take a baby aspirin prior to arrival.  Resting improves the pain.  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 Authorizing Provider  baclofen  (LIORESAL ) 10 MG tablet Take 1 tablet (10 mg total) by mouth 3 (three) times daily. 08/17/24  Yes Bernardino Ditch, NP  ibuprofen  (ADVIL ) 600 MG tablet Take 1 tablet (600 mg total) by mouth every 6 (six) hours as needed. 08/17/24  Yes Bernardino Ditch, NP  ARIPiprazole (ABILIFY) 10 MG tablet Take 10 mg by mouth daily.    [provider]  lithium  carbonate (ESKALITH ) 450 MG ER tablet Take 1 tablet (450 mg total) by mouth at bedtime. 02/23/23   Tex Drilling, NP    Family History Family History  Problem Relation Age of Onset   Heart disease Mother    Heart disease Father     Social History Social History   Tobacco Use   Smoking status: Never   Smokeless tobacco: Never  Vaping Use   Vaping status: Never Used  Substance Use Topics   Alcohol use: Not Currently   Drug use: Not Currently    Frequency: 4.0 times per week    Types: Marijuana     Allergies   Morphine, Lactose intolerance (gi), Penicillins, and Minocycline   Review of Systems Review of Systems  Respiratory:  Negative for cough, shortness of breath and wheezing.   Cardiovascular:  Positive for chest pain. Negative for palpitations.     Physical Exam Triage Vital Signs ED Triage Vitals  Encounter Vitals Group     BP      Girls Systolic BP Percentile      Girls Diastolic BP Percentile      Boys Systolic BP Percentile      Boys Diastolic BP Percentile      Pulse      Resp      Temp      Temp src  SpO2      Weight      Height      Head Circumference      Peak Flow      Pain Score      Pain Loc      Pain Education      Exclude from Growth Chart    No data found.  Updated Vital Signs BP 117/80 (BP Location: Right Arm)   Pulse (!) 58   Temp 98.1 F (36.7 C) (Oral)   Resp 18   Ht 6' (1.829 m)   Wt 212 lb (96.2 kg)   SpO2 96%   BMI 28.75 kg/m   Visual Acuity Right Eye Distance:   Left Eye Distance:   Bilateral Distance:    Right Eye Near:   Left Eye Near:    Bilateral Near:     Physical Exam Vitals and nursing note reviewed.  Constitutional:      Appearance: Normal appearance. He is not ill-appearing.  HENT:     Head: Normocephalic and atraumatic.  Cardiovascular:     Rate and Rhythm: Normal rate and regular rhythm.     Pulses: Normal pulses.     Heart sounds: Normal heart sounds. No murmur  heard.    No friction rub. No gallop.  Pulmonary:     Effort: Pulmonary effort is normal.     Breath sounds: Normal breath sounds. No wheezing, rhonchi or rales.  Chest:     Chest wall: No tenderness.  Skin:    General: Skin is warm and dry.     Capillary Refill: Capillary refill takes less than 2 seconds.     Findings: No erythema or rash.  Neurological:     General: No focal deficit present.     Mental Status: He is alert and oriented to person, place, and time.      UC Treatments / Results  Labs (all labs ordered are listed, but only abnormal results are displayed) Labs Reviewed - No data to display  EKG Sinus bradycardia with a ventricular rate of 57 bpm PR interval 180 ms QRS duration 92 ms QT/QTc 386/375 ms No ST or T wave abnormalities noted.  Radiology DG Chest 2 View Result Date: 08/17/2024 CLINICAL DATA:  Left-sided chest pain radiating to the back. EXAM: CHEST - 2 VIEW COMPARISON:  None Available. FINDINGS: The heart size and mediastinal contours are within normal limits. Both lungs are clear. The visualized skeletal structures are unremarkable. IMPRESSION: No active cardiopulmonary disease. Electronically Signed   By: Suzen Dials M.D.   On: 08/17/2024 11:05    Procedures Procedures (including critical care time)  Medications Ordered in UC Medications - No data to display  Initial Impression / Assessment and Plan / UC Course  I have reviewed the triage vital signs and the nursing notes.  Pertinent labs & imaging results that were available during my care of the patient were reviewed by me and considered in my medical decision making (see chart for details).   Patient is a pleasant, nontoxic-appearing 48 year old male presenting for evaluation of left-sided chest pain as outlined in HPI above.  In the exam room he does not appear to be in any acute distress.  He is able to speak in full sentence without dyspnea or tachypnea.  Respiratory rate was 18 with  a 96% room air oxygen saturation.  His lungs are clear to auscultation all fields and his heart sounds are S1-S2.  There is no reproduction of the chest pain  with palpation of the left anterior chest, right anterior chest, or left posterior chest.  However, when patient does twist, move, take a deep breath the pain does increase.  I have ordered an EKG which shows sinus bradycardia without any ST or T wave abnormalities.  No other tracings available for comparison in epic.  I suspect, given that movement provokes pain and rest relieves the pain that this is musculoskeletal in nature.  He denies any exertion, heavy lifting or straining.  He reports that he did do arm and leg work yesterday at the gym but no chest press.  I will obtain a chest x-ray to evaluate for any acute cardiopulmonary pathology.  Chest x-ray independently reviewed and evaluated by me.  Impression: Lung fields are well aerated without evidence of infiltrate or effusion.  Cardiomediastinal silhouette appears normal.  Radiology read is pending. Radiology impression states no active cardiopulmonary disease.  I will discharge patient home with a diagnosis of musculoskeletal chest wall pain and started him on 600 mg ibuprofen  every 6 hours coupled with 10 mg of baclofen  every 8 hours.  I will encourage him to apply moist heat to his chest and back to help improve blood flow and aid in pain relief.  He may also use topical Salonpas or over-the-counter lidocaine  patches.  If his symptoms not improve he can return for reevaluation or follow-up with his primary care provider.  If he has any sharp increase of chest pain, especially associated with shortness of breath, dizziness, nausea, sweating, or radiation of the pain to his left arm or jaw he should call 911 and go to the ER.   Final Clinical Impressions(s) / UC Diagnoses   Final diagnoses:  Chest pain on breathing  Chest wall pain     Discharge Instructions      Take the ibuprofen   600 mg every 6 hours with food to decrease inflammation and aid in pain relief.  Take the baclofen  10 mg every 8 hours to help alleviate muscle tension and also aid in pain relief.  You may apply moist heat or ice to your anterior chest wall for 20 minutes at a time 2-3 times a day as needed for pain.  Rest is much as possible and avoid any vigorous activity that taxes your chest wall and causes increase in pain.  You may also apply topical lidocaine  patches to your chest wall.  These are available over-the-counter and are good for 8 hours each.  If your symptoms do not improve, or they worsen, you may possibly need injections in your sternal joints to help alleviate the pain.  I would follow-up with orthopedics, such as [facility] if your symptoms do not improve.   If you develop any sharp increase in chest pain, especially if associated with shortness of breath, dizziness, nausea, sweating, or if the pain radiates to your left arm or left jaw you need to go to the ER for evaluation.     ED Prescriptions     Medication Sig Dispense Auth. Provider   ibuprofen  (ADVIL ) 600 MG tablet Take 1 tablet (600 mg total) by mouth every 6 (six) hours as needed. 30 tablet Bernardino Ditch, NP   baclofen  (LIORESAL ) 10 MG tablet Take 1 tablet (10 mg total) by mouth 3 (three) times daily. 30 each Bernardino Ditch, NP      PDMP not reviewed this encounter.   Bernardino Ditch, NP 08/17/24 1112

## 2024-08-17 NOTE — ED Triage Notes (Addendum)
 Pt c/o L sided chest pain that started 3 hrs ago. States pain sharp worse when taking a deep breath. Pain radiates to back. Denies any SOB. Fam hx heart disease.

## 2024-08-22 DIAGNOSIS — K429 Umbilical hernia without obstruction or gangrene: Secondary | ICD-10-CM | POA: Diagnosis not present

## 2024-08-22 DIAGNOSIS — K449 Diaphragmatic hernia without obstruction or gangrene: Secondary | ICD-10-CM | POA: Diagnosis not present

## 2024-08-22 DIAGNOSIS — E785 Hyperlipidemia, unspecified: Secondary | ICD-10-CM | POA: Diagnosis not present

## 2024-08-22 DIAGNOSIS — Z881 Allergy status to other antibiotic agents status: Secondary | ICD-10-CM | POA: Diagnosis not present

## 2024-08-22 DIAGNOSIS — R079 Chest pain, unspecified: Secondary | ICD-10-CM | POA: Diagnosis not present

## 2024-08-22 DIAGNOSIS — K5730 Diverticulosis of large intestine without perforation or abscess without bleeding: Secondary | ICD-10-CM | POA: Diagnosis not present

## 2024-08-22 DIAGNOSIS — D72819 Decreased white blood cell count, unspecified: Secondary | ICD-10-CM | POA: Diagnosis not present

## 2024-08-22 DIAGNOSIS — Z885 Allergy status to narcotic agent status: Secondary | ICD-10-CM | POA: Diagnosis not present

## 2024-08-26 DIAGNOSIS — F331 Major depressive disorder, recurrent, moderate: Secondary | ICD-10-CM | POA: Diagnosis not present

## 2024-09-19 DIAGNOSIS — R079 Chest pain, unspecified: Secondary | ICD-10-CM | POA: Diagnosis not present

## 2024-09-20 DIAGNOSIS — R079 Chest pain, unspecified: Secondary | ICD-10-CM | POA: Diagnosis not present

## 2024-10-11 DIAGNOSIS — R079 Chest pain, unspecified: Secondary | ICD-10-CM | POA: Diagnosis not present

## 2024-10-12 DIAGNOSIS — I2510 Atherosclerotic heart disease of native coronary artery without angina pectoris: Secondary | ICD-10-CM | POA: Diagnosis not present

## 2024-10-12 DIAGNOSIS — R079 Chest pain, unspecified: Secondary | ICD-10-CM | POA: Diagnosis not present

## 2024-11-14 DIAGNOSIS — R079 Chest pain, unspecified: Secondary | ICD-10-CM | POA: Diagnosis not present

## 2024-11-14 DIAGNOSIS — Z5181 Encounter for therapeutic drug level monitoring: Secondary | ICD-10-CM | POA: Diagnosis not present

## 2024-11-14 DIAGNOSIS — Z79899 Other long term (current) drug therapy: Secondary | ICD-10-CM | POA: Diagnosis not present
